# Patient Record
Sex: Female | Born: 1985 | Hispanic: Yes | Smoking: Never smoker
Health system: Southern US, Community
[De-identification: ages and names within clinical notes are randomized; demographics above are authoritative.]

## PROBLEM LIST (undated history)

## (undated) DIAGNOSIS — R609 Edema, unspecified: Secondary | ICD-10-CM

## (undated) DIAGNOSIS — G43909 Migraine, unspecified, not intractable, without status migrainosus: Secondary | ICD-10-CM

## (undated) DIAGNOSIS — D649 Anemia, unspecified: Secondary | ICD-10-CM

## (undated) HISTORY — PX: TUBAL LIGATION: SHX77

## (undated) HISTORY — DX: Edema, unspecified: R60.9

## (undated) HISTORY — DX: Anemia, unspecified: D64.9

---

## 2015-07-22 ENCOUNTER — Emergency Department (HOSPITAL_COMMUNITY)
Admission: EM | Admit: 2015-07-22 | Discharge: 2015-07-22 | Disposition: A | Discharge status: Home / Self Care | Payer: Self-pay | Attending: Emergency Medicine | Admitting: Emergency Medicine

## 2015-07-22 ENCOUNTER — Emergency Department (HOSPITAL_COMMUNITY): Payer: Self-pay

## 2015-07-22 ENCOUNTER — Encounter (HOSPITAL_COMMUNITY): Payer: Self-pay | Admitting: Nurse Practitioner

## 2015-07-22 DIAGNOSIS — Y998 Other external cause status: Secondary | ICD-10-CM | POA: Insufficient documentation

## 2015-07-22 DIAGNOSIS — Y9289 Other specified places as the place of occurrence of the external cause: Secondary | ICD-10-CM | POA: Insufficient documentation

## 2015-07-22 DIAGNOSIS — X58XXXA Exposure to other specified factors, initial encounter: Secondary | ICD-10-CM | POA: Insufficient documentation

## 2015-07-22 DIAGNOSIS — M545 Low back pain, unspecified: Secondary | ICD-10-CM

## 2015-07-22 DIAGNOSIS — Y9389 Activity, other specified: Secondary | ICD-10-CM | POA: Insufficient documentation

## 2015-07-22 DIAGNOSIS — S3992XA Unspecified injury of lower back, initial encounter: Secondary | ICD-10-CM | POA: Insufficient documentation

## 2015-07-22 DIAGNOSIS — Z87891 Personal history of nicotine dependence: Secondary | ICD-10-CM | POA: Insufficient documentation

## 2015-07-22 DIAGNOSIS — S8991XA Unspecified injury of right lower leg, initial encounter: Secondary | ICD-10-CM | POA: Insufficient documentation

## 2015-07-22 DIAGNOSIS — M25561 Pain in right knee: Secondary | ICD-10-CM

## 2015-07-22 MED ORDER — NAPROXEN 500 MG PO TABS: 500.0000 mg | ORAL_TABLET | Freq: Two times a day (BID) | ORAL | Status: DC

## 2015-07-22 MED ORDER — KETOROLAC TROMETHAMINE 60 MG/2ML IM SOLN
60.0000 mg | Freq: Once | INTRAMUSCULAR | Status: AC
Start: 2015-07-22 — End: 2015-07-22
  Administered 2015-07-22: 60 mg via INTRAMUSCULAR
  Filled 2015-07-22: qty 2

## 2015-07-22 MED ORDER — METHOCARBAMOL 500 MG PO TABS
500.0000 mg | ORAL_TABLET | Freq: Two times a day (BID) | ORAL | Status: DC
Start: 2015-07-22 — End: 2019-08-23

## 2015-07-22 NOTE — Discharge Instructions (Signed)
Back Pain, Adult °Back pain is very common in adults. The cause of back pain is rarely dangerous and the pain often gets better over time. The cause of your back pain may not be known. Some common causes of back pain include: °· Strain of the muscles or ligaments supporting the spine. °· Wear and tear (degeneration) of the spinal disks. °· Arthritis. °· Direct injury to the back. °For many people, back pain may return. Since back pain is rarely dangerous, most people can learn to manage this condition on their own. °HOME CARE INSTRUCTIONS °Watch your back pain for any changes. The following actions may help to lessen any discomfort you are feeling: °· Remain active. It is stressful on your back to sit or stand in one place for long periods of time. Do not sit, drive, or stand in one place for more than 30 minutes at a time. Take short walks on even surfaces as soon as you are able. Try to increase the length of time you walk each day. °· Exercise regularly as directed by your health care provider. Exercise helps your back heal faster. It also helps avoid future injury by keeping your muscles strong and flexible. °· Do not stay in bed. Resting more than 1-2 days can delay your recovery. °· Pay attention to your body when you bend and lift. The most comfortable positions are those that put less stress on your recovering back. Always use proper lifting techniques, including: °· Bending your knees. °· Keeping the load close to your body. °· Avoiding twisting. °· Find a comfortable position to sleep. Use a firm mattress and lie on your side with your knees slightly bent. If you lie on your back, put a pillow under your knees. °· Avoid feeling anxious or stressed. Stress increases muscle tension and can worsen back pain. It is important to recognize when you are anxious or stressed and learn ways to manage it, such as with exercise. °· Take medicines only as directed by your health care provider. Over-the-counter  medicines to reduce pain and inflammation are often the most helpful. Your health care provider may prescribe muscle relaxant drugs. These medicines help dull your pain so you can more quickly return to your normal activities and healthy exercise. °· Apply ice to the injured area: °· Put ice in a plastic bag. °· Place a towel between your skin and the bag. °· Leave the ice on for 20 minutes, 2-3 times a day for the first 2-3 days. After that, ice and heat may be alternated to reduce pain and spasms. °· Maintain a healthy weight. Excess weight puts extra stress on your back and makes it difficult to maintain good posture. °SEEK MEDICAL CARE IF: °· You have pain that is not relieved with rest or medicine. °· You have increasing pain going down into the legs or buttocks. °· You have pain that does not improve in one week. °· You have night pain. °· You lose weight. °· You have a fever or chills. °SEEK IMMEDIATE MEDICAL CARE IF:  °· You develop new bowel or bladder control problems. °· You have unusual weakness or numbness in your arms or legs. °· You develop nausea or vomiting. °· You develop abdominal pain. °· You feel faint. °  °This information is not intended to replace advice given to you by your health care provider. Make sure you discuss any questions you have with your health care provider. °  °Document Released: 04/30/2005 Document Revised: 05/21/2014 Document Reviewed: 09/01/2013 °Elsevier Interactive Patient Education ©2016 Else[employer]   Inc[employer]  Back Exercises The following exercises strengthen the muscles that help to support the back. They also help to keep the lower back flexible. Doing these exercises can help to prevent back pain or lessen existing pain. If you have back pain or discomfort, try doing these exercises 2-3 times each day or as told by your health care provider. When the pain goes away, do them once each day, but increase the number of times that you repeat the steps for each exercise (do more  repetitions). If you do not have back pain or discomfort, do these exercises once each day or as told by your health care provider. EXERCISES Single Knee to Chest Repeat these steps 3-5 times for each leg:  Lie on your back on a firm bed or the floor with your legs extended.  Bring one knee to your chest. Your other leg should stay extended and in contact with the floor.  Hold your knee in place by grabbing your knee or thigh.  Pull on your knee until you feel a gentle stretch in your lower back.  Hold the stretch for 10-30 seconds.  Slowly release and straighten your leg. Pelvic Tilt Repeat these steps 5-10 times:  Lie on your back on a firm bed or the floor with your legs extended.  Bend your knees so they are pointing toward the ceiling and your feet are flat on the floor.  Tighten your lower abdominal muscles to press your lower back against the floor. This motion will tilt your pelvis so your tailbone points up toward the ceiling instead of pointing to your feet or the floor.  With gentle tension and even breathing, hold this position for 5-10 seconds. Cat-Cow Repeat these steps until your lower back becomes more flexible:  Get into a hands-and-knees position on a firm surface. Keep your hands under your shoulders, and keep your knees under your hips. You may place padding under your knees for comfort.  Let your head hang down, and point your tailbone toward the floor so your lower back becomes rounded like the back of a cat.  Hold this position for 5 seconds.  Slowly lift your head and point your tailbone up toward the ceiling so your back forms a sagging arch like the back of a cow.  Hold this position for 5 seconds. Press-Ups Repeat these steps 5-10 times:  Lie on your abdomen (face-down) on the floor.  Place your palms near your head, about shoulder-width apart.  While you keep your back as relaxed as possible and keep your hips on the floor, slowly straighten  your arms to raise the top half of your body and lift your shoulders. Do not use your back muscles to raise your upper torso. You may adjust the placement of your hands to make yourself more comfortable.  Hold this position for 5 seconds while you keep your back relaxed.  Slowly return to lying flat on the floor. Bridges Repeat these steps 10 times:  Lie on your back on a firm surface.  Bend your knees so they are pointing toward the ceiling and your feet are flat on the floor.  Tighten your buttocks muscles and lift your buttocks off of the floor until your waist is at almost the same height as your knees. You should feel the muscles working in your buttocks and the back of your thighs. If you do not feel these muscles, slide your feet 1-2 inches farther away from your buttocks.  Hold this  position for 3-5 seconds.  Slowly lower your hips to the starting position, and allow your buttocks muscles to relax completely. If this exercise is too easy, try doing it with your arms crossed over your chest. Abdominal Crunches Repeat these steps 5-10 times: 1. Lie on your back on a firm bed or the floor with your legs extended. 2. Bend your knees so they are pointing toward the ceiling and your feet are flat on the floor. 3. Cross your arms over your chest. 4. Tip your chin slightly toward your chest without bending your neck. 5. Tighten your abdominal muscles and slowly raise your trunk (torso) high enough to lift your shoulder blades a tiny bit off of the floor. Avoid raising your torso higher than that, because it can put too much stress on your low back and it does not help to strengthen your abdominal muscles. 6. Slowly return to your starting position. Back Lifts Repeat these steps 5-10 times: 1. Lie on your abdomen (face-down) with your arms at your sides, and rest your forehead on the floor. 2. Tighten the muscles in your legs and your buttocks. 3. Slowly lift your chest off of the floor  while you keep your hips pressed to the floor. Keep the back of your head in line with the curve in your back. Your eyes should be looking at the floor. 4. Hold this position for 3-5 seconds. 5. Slowly return to your starting position. SEEK MEDICAL CARE IF:  Your back pain or discomfort gets much worse when you do an exercise.  Your back pain or discomfort does not lessen within 2 hours after you exercise. If you have any of these problems, stop doing these exercises right away. Do not do them again unless your health care provider says that you can. SEEK IMMEDIATE MEDICAL CARE IF:  You develop sudden, severe back pain. If this happens, stop doing the exercises right away. Do not do them again unless your health care provider says that you can.   This information is not intended to replace advice given to you by your health care provider. Make sure you discuss any questions you have with your health care provider.   Document Released: 06/07/2004 Document Revised: 01/19/2015 Document Reviewed: 06/24/2014 Elsevier Interactive Patient Education 2016 Else[employer] Inc.  Knee Pain Knee pain is a very common symptom and can have many causes. Knee pain often goes away when you follow your health care provider's instructions for relieving pain and discomfort at home. However, knee pain can develop into a condition that needs treatment. Some conditions may include:  Arthritis caused by wear and tear (osteoarthritis).  Arthritis caused by swelling and irritation (rheumatoid arthritis or gout).  A cyst or growth in your knee.  An infection in your knee joint.  An injury that will not heal.  Damage, swelling, or irritation of the tissues that support your knee (torn ligaments or tendinitis). If your knee pain continues, additional tests may be ordered to diagnose your condition. Tests may include X-rays or other imaging studies of your knee. You may also need to have fluid removed from your knee.  Treatment for ongoing knee pain depends on the cause, but treatment may include:  Medicines to relieve pain or swelling.  Steroid injections in your knee.  Physical therapy.  Surgery. HOME CARE INSTRUCTIONS  Take medicines only as directed by your health care provider.  Rest your knee and keep it raised (elevated) while you are resting.  Do not do things  that cause or worsen pain.  Avoid high-impact activities or exercises, such as running, jumping rope, or doing jumping jacks.  Apply ice to the knee area:  Put ice in a plastic bag.  Place a towel between your skin and the bag.  Leave the ice on for 20 minutes, 2-3 times a day.  Ask your health care provider if you should wear an elastic knee support.  Keep a pillow under your knee when you sleep.  Lose weight if you are overweight. Extra weight can put pressure on your knee.  Do not use any tobacco products, including cigarettes, chewing tobacco, or electronic cigarettes. If you need help quitting, ask your health care provider. Smoking may slow the healing of any bone and joint problems that you may have. SEEK MEDICAL CARE IF:  Your knee pain continues, changes, or gets worse.  You have a fever along with knee pain.  Your knee buckles or locks up.  Your knee becomes more swollen. SEEK IMMEDIATE MEDICAL CARE IF:   Your knee joint feels hot to the touch.  You have chest pain or trouble breathing.   This information is not intended to replace advice given to you by your health care provider. Make sure you discuss any questions you have with your health care provider.   Document Released: 02/25/2007 Document Revised: 05/21/2014 Document Reviewed: 12/14/2013 Elsevier Interactive Patient Education [employer] Else[employer] Inc.

## 2015-07-22 NOTE — ED Notes (Signed)
Pt endorses 2 days of right knee pain states happened all of a sudden while walking. Straightening knee causes increase pain. Patient also endorses lower back pain worse with movement and bending. Denies dysuria or abnormal bleeding. Pt takes ibuprofen for pain with mild relief.

## 2015-07-22 NOTE — ED Provider Notes (Signed)
CSN: [id number]     Arrival date & time 07/22/15  1513 History  By signing my name below, I, Lyndel Safe, attest that this documentation has been prepared under the direction and in the presence of Syna Gad, PA-C. Electronically Signed: Lyndel Safe, ED Scribe. 07/22/2015. 4:37 PM.   Chief Complaint  Patient presents with  . Knee Pain  . Back Pain   The history is provided by the patient. No language interpreter was used.   HPI Comments: Ellesse Antenucci is a 30 y.o. female who presents to the Emergency Department complaining of waxing and waning, anterior right knee pain present only with extension and pressure, onset suddenly while ambulating 2 days ago. Pt describes the feeling that her knee needed to pop when she was walking but that "nothing happened". There is no deformity noted to right knee, however she notes swelling to anterior right knee at onset of pain that has improved. She states the pain is only present with full extension of right leg and with putting pressure on right knee cap when she bends onto her knees. Additionally she reports the feeling of instability in her right leg with ambulation and states she has been walking with a limp secondary to knee pain. No h/o right knee injury. Pt has taken ibuprofen without significant relief but she has not applied ice or heat to the area. Denies numbness tingling or weakness of right foot. Denies pain, swelling or redness of the calf.   Pt also c/o constant, non-radiating, bilateral lower back pain onset 2-3 weeks ago, while bending over bathing her kids. Denies lift injury. This back pain is present with sitting in any position and is exacerbated with standing and bending over; but it is alleviated with arching of her back. She denies any urinary symptoms, radiation of pain down BLE, numbness, tingling or weakness, or any overlying skin changes. No loss of bladder or bowels.    History reviewed. No pertinent past medical  history. Past Surgical History  Procedure Laterality Date  . Tubal ligation     No family history on file. Social History  Substance Use Topics  . Smoking status: Former Games developer  . Smokeless tobacco: None  . Alcohol Use: No   OB History    No data available     Review of Systems  Genitourinary: Negative for dysuria, urgency, frequency and hematuria.  Musculoskeletal: Positive for back pain and arthralgias ( R knee). Negative for gait problem.  Skin: Negative for color change and wound.  Neurological: Negative for weakness and numbness.   Allergies  Iodine  Home Medications   Prior to Admission medications   Medication Sig Start Date End Date Taking? Authorizing Provider  methocarbamol (ROBAXIN) 500 MG tablet Take 1 tablet (500 mg total) by mouth 2 (two) times daily. 07/22/15   Luccas Towell, PA-C  naproxen (NAPROSYN) 500 MG tablet Take 1 tablet (500 mg total) by mouth 2 (two) times daily. 07/22/15   Nicolemarie Wooley, PA-C   BP 135/73 mmHg  Pulse 76  Temp(Src) 99 F (37.2 C) (Oral)  Resp 18  Ht  (1.575 m)  Wt 180 lb (81.647 kg)  BMI 32.91 kg/m2  SpO2 98%  LMP 07/21/2015 Physical Exam  Constitutional: She is oriented to person, place, and time. She appears well-developed and well-nourished. No distress.  HENT:  Head: Normocephalic and atraumatic.  Right Ear: External ear normal.  Left Ear: External ear normal.  Eyes: Conjunctivae are normal. Right eye exhibits no discharge. Left eye  exhibits no discharge. No scleral icterus.  Neck: Normal range of motion. Neck supple.  Cardiovascular: Normal rate and intact distal pulses.   Pedal pulse palpable.  Pulmonary/Chest: Effort normal. No respiratory distress.  Musculoskeletal: Normal range of motion.       Right knee: Normal. She exhibits normal range of motion, no swelling, no effusion, no ecchymosis, no deformity, normal alignment, no LCL laxity, normal patellar mobility and no MCL laxity. No tenderness found.        Lumbar back: Normal. She exhibits normal range of motion, no tenderness, no deformity and no spasm.       Legs: No obvious deformity or swelling of the right knee. No tenderness to palpation of the knee. No bony patella deformity. Full range of motion intact. No appreciable effusion. No ligamentous laxity. No erythema or warmth. No calf tenderness, swelling, palpable cords or erythema. Range of motion at the ankle and toes. Patient is able to walk though she favors the right leg. No tenderness to palpation over the lumbar region. No focal tenderness over the lumbar spine. Full range of motion of the lumbar and thoracic spine intact. No spasm felt. Full range of motion of the left lower extremity intact.  Neurological: She is alert and oriented to person, place, and time. Coordination normal.  5/5 strength of the bilateral lower extremities. Sensation to light touch intact throughout.  Skin: Skin is warm and dry. No erythema.  Erythema or warmth of the right knee. No skin changes noted over the lumbar region.  Psychiatric: She has a normal mood and affect. Her behavior is normal.  Nursing note and vitals reviewed.   ED Course  Procedures  DIAGNOSTIC STUDIES: Oxygen Saturation is 98% on RA, normal by my interpretation.    COORDINATION OF CARE: 4:31 PM Discussed treatment plan with pt at bedside and pt agreed to plan.   Imaging Review Dg Knee Complete 4 Views Right  07/22/2015  CLINICAL DATA:  30 year old female with acute right knee pain and swelling for 2 days. No known injury. Initial encounter. EXAM: RIGHT KNEE - COMPLETE 4+ VIEW COMPARISON:  None. FINDINGS: There is no evidence of fracture, dislocation, or joint effusion. There is no evidence of arthropathy or other focal bone abnormality. Soft tissues are unremarkable. IMPRESSION: Negative. Electronically Signed   By: Harmon Pier M.D.   On: 07/22/2015 16:23   I have personally reviewed and evaluated these images results as part of my  medical decision-making.   MDM   Final diagnoses:  Bilateral low back pain without sciatica  Right knee pain   30 year old female presenting with back pain 2 weeks and right knee pain 2 days. Vital signs stable. Patient is nontoxic-appearing. Right leg is neurovascularly intact with FROM. No tenderness over right knee. No tenderness over lumbar region. No bony deformity. Non-focal neuro exam. Patient knee X-Ray negative for obvious fracture or dislocation. Pain managed in ED with toradol. Pt is able to ambulate though with some discomfort. No loss of bowel or bladder control. No numbness or weakness in the lower extremities. No concern for cauda equina. Crutches given for comfort and conservative therapy recommended. Discussed RICE therapy and use of OTC pain relievers. Will also give short course of muscle relaxer for back pain. Discussed side effects of muscle relaxer and avoiding driving. Pt advised to follow up with orthopedics if symptoms persist. Return precautions discussed at bedside and given in discharge paperwork. Pt is stable for discharge.  I personally performed the services described in  this documentation, which was scribed in my presence. The recorded information has been reviewed and is accurate.    Rolm GalaStevi Zayne Draheim, PA-C 07/22/15 1719  Gwyneth SproutWhitney Plunkett, MD 07/23/15 [id number]

## 2015-10-22 ENCOUNTER — Emergency Department (HOSPITAL_COMMUNITY)
Admission: EM | Admit: 2015-10-22 | Discharge: 2015-10-22 | Disposition: A | Discharge status: Home / Self Care | Payer: Self-pay | Attending: Emergency Medicine | Admitting: Emergency Medicine

## 2015-10-22 ENCOUNTER — Encounter (HOSPITAL_COMMUNITY): Payer: Self-pay

## 2015-10-22 DIAGNOSIS — Z79899 Other long term (current) drug therapy: Secondary | ICD-10-CM | POA: Insufficient documentation

## 2015-10-22 DIAGNOSIS — G5603 Carpal tunnel syndrome, bilateral upper limbs: Secondary | ICD-10-CM | POA: Insufficient documentation

## 2015-10-22 DIAGNOSIS — Z87891 Personal history of nicotine dependence: Secondary | ICD-10-CM | POA: Insufficient documentation

## 2015-10-22 DIAGNOSIS — Z791 Long term (current) use of non-steroidal anti-inflammatories (NSAID): Secondary | ICD-10-CM | POA: Insufficient documentation

## 2015-10-22 MED ORDER — NAPROXEN 500 MG PO TABS
500.0000 mg | ORAL_TABLET | Freq: Two times a day (BID) | ORAL | Status: DC | PRN
Start: 2015-10-22 — End: 2019-08-23

## 2015-10-22 NOTE — ED Notes (Addendum)
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

## 2015-10-22 NOTE — ED Notes (Signed)
Pt states unable to sleep due to fingers in left hand painful and numbness x 2 days. Denies any neck or back pain. Denies any trauma to arms/fingers. Pt states decreased grip strength.

## 2015-10-22 NOTE — ED Provider Notes (Signed)
CSN: [id number]     Arrival date & time 10/22/15  0017 History   First MD Initiated Contact with Patient 10/22/15 [phone]     Chief Complaint  Patient presents with  . Numbness     (Consider location/radiation/quality/duration/timing/severity/associated sxs/prior Treatment) The history is provided by the patient and medical records. No language interpreter was used.   Exa Julie Briggs is a 30 y.o. female  with no pertinent PMH who presents to the Emergency Department complaining of worsening 5/10 bilateral wrist pain 2 days. Associated symptoms include numbness of the hands and fingers sparing pinky. Yesterday she noticed decreased grip strength. Symptoms worst at night or first thing in the morning. 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 No hx of similar symptoms. No medication or treatments PTA for symptoms.    History reviewed. No pertinent past medical history. Past Surgical History  Procedure Laterality Date  . Tubal ligation     History reviewed. No pertinent family history. Social History  Substance Use Topics  . Smoking status: Former Games developer  . Smokeless tobacco: None  . Alcohol Use: No   OB History    No data available     Review of Systems  Musculoskeletal: Positive for arthralgias.  Neurological: Positive for numbness.   10 Systems reviewed and are negative for acute change except as noted in the HPI.    Allergies  Iodine  Home Medications   Prior to Admission medications   Medication Sig Start Date End Date Taking? Authorizing Provider  methocarbamol (ROBAXIN) 500 MG tablet Take 1 tablet (500 mg total) by mouth 2 (two) times daily. 07/22/15   Stevi Barrett, PA-C  naproxen (NAPROSYN) 500 MG tablet Take 1 tablet (500 mg total) by mouth 2 (two) times daily as needed. 10/22/15   Shakiyla Kook Pilcher Saraia Platner, PA-C   BP 121/74 mmHg  Pulse 66  Temp(Src) 98.6 F (37 C) (Oral)  Resp 16   SpO2 100%  LMP 10/01/2015 Physical Exam  Constitutional: She is oriented to person, place, and time. She appears well-developed and well-nourished.  Alert and in no acute distress  HENT:  Head: Normocephalic and atraumatic.  Neck: Normal range of motion. Neck supple.  Cardiovascular: Normal rate, regular rhythm and normal heart sounds.   Pulmonary/Chest: Effort normal and breath sounds normal. No respiratory distress.  Musculoskeletal:  Bilateral wrists: Full ROM. No joint effusion noted. No erythema or warmth overlaying the joint. No TTP. Decreased sensation of radial aspect of hand compared to ulnar. 2+ radial pulse. + Tinel's sign. 5/5 muscle strength in bilateral UE's including grip strength.   Neurological: She is alert and oriented to person, place, and time.  Skin: Skin is warm and dry.  Nursing note and vitals reviewed.   ED Course  Procedures (including critical care time) Labs Review Labs Reviewed - No data to display  Imaging Review No results found. I have personally reviewed and evaluated these images and lab results as part of my medical decision-making.   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 On exam, + tinel's and decreased sensation in median nerve distribution. Grip strength strong. No erythema or swelling. Findings c/w carpal tunnel. Wrist braces given  in ED. Rx for naproxen given. Symptomatic home care instructions discussed. PCP follow up encouraged. All questions answered.   [facility] BaltimoreJaime Pilcher Kemiyah Tarazon, PA-C 10/22/15 0448  Gwyneth SproutWhitney Plunkett, MD 10/22/15 [phone]

## 2015-10-22 NOTE — Progress Notes (Signed)
Orthopedic Tech Progress Note Patient Details:  Marcheta GrammesMayerlin Aguilera 06/23/1985 [id number]  Ortho Devices Type of Ortho Device: Velcro wrist splint Ortho Device/Splint Location: bi velcro wrist splint Ortho Device/Splint Interventions: Ordered, Application   Trinna PostMartinez, Arriyana Rodell J 10/22/2015, 4:14 AM

## 2015-10-22 NOTE — Discharge Instructions (Signed)
1. Medications: Take naproxen (your anti-inflammatory) as needed, continue usual home medications 2. Treatment: rest, ice affected area twice daily (instructions below), wear wrist splints **especially at night!!  3. Follow Up: Please follow up with your primary doctor for discussion of your diagnoses and further evaluation after today's visit; Please return to the ER for new or worsening symptoms, any additional concerns.   COLD THERAPY DIRECTIONS:  Ice or gel packs can be used to reduce both pain and swelling. Ice is the most helpful within the first 24 to 48 hours after an injury or flareup from overusing a muscle or joint.  Ice is effective, has very few side effects, and is safe for most people to use.   If you expose your skin to cold temperatures for too long or without the proper protection, you can damage your skin or nerves. Watch for signs of skin damage due to cold.   HOME CARE INSTRUCTIONS  Follow these tips to use ice and cold packs safely.  Place a dry or damp towel between the ice and skin. A damp towel will cool the skin more quickly, so you may need to shorten the time that the ice is used.  For a more rapid response, add gentle compression to the ice.  Ice for no more than 10 to 20 minutes at a time. The bonier the area you are icing, the less time it will take to get the benefits of ice.  Check your skin after 5 minutes to make sure there are no signs of a poor response to cold or skin damage.  Rest 20 minutes or more in between uses.  Once your skin is numb, you can end your treatment. You can test numbness by very lightly touching your skin. The touch should be so light that you do not see the skin dimple from the pressure of your fingertip. When using ice, most people will feel these normal sensations in this order: cold, burning, aching, and numbness.

## 2016-04-26 ENCOUNTER — Emergency Department (HOSPITAL_COMMUNITY)
Admission: EM | Admit: 2016-04-26 | Discharge: 2016-04-26 | Disposition: A | Discharge status: Home / Self Care | Payer: Self-pay | Attending: Emergency Medicine | Admitting: Emergency Medicine

## 2016-04-26 ENCOUNTER — Encounter (HOSPITAL_COMMUNITY): Payer: Self-pay | Admitting: Emergency Medicine

## 2016-04-26 DIAGNOSIS — Y929 Unspecified place or not applicable: Secondary | ICD-10-CM | POA: Insufficient documentation

## 2016-04-26 DIAGNOSIS — L089 Local infection of the skin and subcutaneous tissue, unspecified: Secondary | ICD-10-CM

## 2016-04-26 DIAGNOSIS — Z87891 Personal history of nicotine dependence: Secondary | ICD-10-CM | POA: Insufficient documentation

## 2016-04-26 DIAGNOSIS — Y939 Activity, unspecified: Secondary | ICD-10-CM | POA: Insufficient documentation

## 2016-04-26 DIAGNOSIS — K047 Periapical abscess without sinus: Secondary | ICD-10-CM | POA: Insufficient documentation

## 2016-04-26 DIAGNOSIS — S80861A Insect bite (nonvenomous), right lower leg, initial encounter: Secondary | ICD-10-CM | POA: Insufficient documentation

## 2016-04-26 DIAGNOSIS — W57XXXA Bitten or stung by nonvenomous insect and other nonvenomous arthropods, initial encounter: Secondary | ICD-10-CM | POA: Insufficient documentation

## 2016-04-26 DIAGNOSIS — Y999 Unspecified external cause status: Secondary | ICD-10-CM | POA: Insufficient documentation

## 2016-04-26 MED ORDER — TRAMADOL HCL 50 MG PO TABS: 50.0000 mg | ORAL_TABLET | Freq: Four times a day (QID) | ORAL | 0 refills | Status: DC | PRN

## 2016-04-26 MED ORDER — CLINDAMYCIN HCL 150 MG PO CAPS: 300.0000 mg | ORAL_CAPSULE | Freq: Three times a day (TID) | ORAL | 0 refills | Status: DC

## 2016-04-26 MED ORDER — CLINDAMYCIN HCL 150 MG PO CAPS
300.0000 mg | ORAL_CAPSULE | Freq: Once | ORAL | Status: AC
  Administered 2016-04-26: 300 mg via ORAL
  Filled 2016-04-26: qty 2

## 2016-04-26 NOTE — ED Triage Notes (Signed)
Patient reports worsening right lower molar pain onset 2 days ago unrelieved by OTC Tylenol , pt. added skin rash at right lateral shin .

## 2016-04-26 NOTE — ED Notes (Signed)
Pt departed in NAD, refused use of wheelchair.  

## 2016-04-26 NOTE — ED Provider Notes (Signed)
[facility]-EMERGENCY DEPT Provider Note   By signing my name below, I, Earmon PhoenixJennifer Waddell, attest that this documentation has been prepared under the direction and in the presence of [facility] Pax Reasoner, [place]. Electronically Signed: Earmon PhoenixJennifer Waddell, ED Scribe. 04/26/16. 7:30 PM.    History   Chief Complaint Chief Complaint  Patient presents with  . Dental Pain   The history is provided by the patient and medical records. No language interpreter was used.  Dental Pain   This is a new problem. The current episode started 2 days ago. The problem occurs constantly. The problem has not changed since onset.The pain is moderate. She has tried acetaminophen for the symptoms. The treatment provided no relief.    HPI Comments:  Julie Briggs is a 30 y.o. female who presents to the Emergency Department complaining of right lower dental pain that began two days ago. She reports associated swelling of the right side of her face. She has been taking Tylenol with no significant relief of the pain. There are no modifying factors noted. She denies fever, chills, nausea, vomiting, difficulty breathing or swallowing, drooling or trismus.  She also reports a small area of redness to the right lower leg that appeared yesterday. She is unsure if something bit her. She has not done anything to treat the area. She denies modifying factors. She denies drainage from the area.   History reviewed. No pertinent past medical history.  There are no active problems to display for this patient.   Past Surgical History:  Procedure Laterality Date  . TUBAL LIGATION      OB History    No data available       Home Medications    Prior to Admission medications   Medication Sig Start Date End Date Taking? Authorizing Provider  clindamycin (CLEOCIN) 150 MG capsule Take 2 capsules (300 mg total) by mouth 3 (three) times daily. 04/26/16   Shantele Reller Orlene OchM Elgene Coral, NP  methocarbamol (ROBAXIN) 500 MG tablet Take 1 tablet (500 mg total) by  mouth 2 (two) times daily. 07/22/15   Stevi Barrett, PA-C  naproxen (NAPROSYN) 500 MG tablet Take 1 tablet (500 mg total) by mouth 2 (two) times daily as needed. 10/22/15   Chase PicketJaime Pilcher Ward, PA-C  traMADol (ULTRAM) 50 MG tablet Take 1 tablet (50 mg total) by mouth every 6 (six) hours as needed. 04/26/16   Kylar Speelman Orlene OchM Bailyn Spackman, NP    Family History No family history on file.  Social History Social History  Substance Use Topics  . Smoking status: Former Games developermoker  . Smokeless tobacco: Never Used  . Alcohol use No     Allergies   Iodine   Review of Systems Review of Systems  Constitutional: Negative for chills and fever.  HENT: Positive for dental problem and facial swelling. Negative for drooling and trouble swallowing.   Respiratory: Negative for shortness of breath.   Gastrointestinal: Negative for abdominal pain, nausea and vomiting.  Skin: Positive for color change.  all other systems negative   Physical Exam Updated Vital Signs BP 136/75 (BP Location: Right Arm)   Pulse 95   Temp 98.6 F (37 C) (Oral)   Resp 19   LMP 04/12/2016 (Approximate)   SpO2 96%   Physical Exam  Constitutional: She is oriented to person, place, and time. She appears well-developed and well-nourished. No distress.  HENT:  Right Ear: Tympanic membrane and ear canal normal.  Left Ear: Tympanic membrane and ear canal normal.  Mouth/Throat: Uvula is midline, oropharynx is clear  and moist and mucous membranes are normal. No trismus in the jaw. Abnormal dentition. Dental abscesses and dental caries present.    First molar on lower right is decayed with swelling of gingiva surrounding the tooth consistent with abscess. Mild facial swelling on the right.  Eyes: EOM are normal. Pupils are equal, round, and reactive to light.  Sclera clear bilaterally.  Neck: Neck supple.  Cardiovascular: Normal rate and regular rhythm.   Pulmonary/Chest: Effort normal and breath sounds normal.  Abdominal: Soft. There is no  tenderness.  Musculoskeletal: Normal range of motion.  Lymphadenopathy:    She has cervical adenopathy.  Neurological: She is alert and oriented to person, place, and time. No cranial nerve deficit.  Skin: Skin is warm and dry. There is erythema.  1 cm area of redness with pustular center and tenderness on right lower leg.  Psychiatric: She has a normal mood and affect. Her behavior is normal.  Nursing note and vitals reviewed.    ED Treatments / Results   DIAGNOSTIC STUDIES: Oxygen Saturation is 96% on RA, adequate by my interpretation.   COORDINATION OF CARE: 7:28 PM- Will prescribe Clindamycin and dental referral. Pt verbalizes understanding and agrees to plan.  Medications  clindamycin (CLEOCIN) capsule 300 mg (300 mg Oral Given 04/26/16 1940)   Labs (all labs ordered are listed, but only abnormal results are displayed) Labs Reviewed - No data to display   Radiology No results found.  Procedures Procedures (including critical care time)  Medications Ordered in ED Medications  clindamycin (CLEOCIN) capsule 300 mg (300 mg Oral Given 04/26/16 1940)     Initial Impression / Assessment and Plan / ED Course  I have reviewed the triage vital signs and the nursing notes.  Clinical Course     Patient with dentalgia.  No abscess requiring immediate incision and drainage.  Exam not concerning for Ludwig's angina or pharyngeal abscess.  Will treat with Clindamycin. Pt instructed to follow-up with dentist.  Discussed return precautions. Pt safe for discharge. Also area to the right lower leg that appears as an infected insect bite.  I personally performed the services described in this documentation, which was scribed in my presence. The recorded information has been reviewed and is accurate.   Final Clinical Impressions(s) / ED Diagnoses   Final diagnoses:  Dental abscess  Infected insect bite of lower leg, right, initial encounter    New Prescriptions Discharge  Medication List as of 04/26/2016  7:30 PM    START taking these medications   Details  clindamycin (CLEOCIN) 150 MG capsule Take 2 capsules (300 mg total) by mouth 3 (three) times daily., Starting Thu 04/26/2016, Print    traMADol (ULTRAM) 50 MG tablet Take 1 tablet (50 mg total) by mouth every 6 (six) hours as needed., Starting Thu 04/26/2016, Print         [place]oylestownHope M Yehuda Printup, NP 04/26/16 2325    Nira ConnPedro Eduardo Cardama, MD 05/01/16 [phone]

## 2016-06-21 ENCOUNTER — Emergency Department (HOSPITAL_COMMUNITY): Payer: Self-pay

## 2016-06-21 ENCOUNTER — Emergency Department (HOSPITAL_COMMUNITY)
Admission: EM | Admit: 2016-06-21 | Discharge: 2016-06-21 | Disposition: A | Discharge status: Home / Self Care | Payer: Self-pay | Attending: Emergency Medicine | Admitting: Emergency Medicine

## 2016-06-21 ENCOUNTER — Encounter (HOSPITAL_COMMUNITY): Payer: Self-pay | Admitting: Emergency Medicine

## 2016-06-21 DIAGNOSIS — Z87891 Personal history of nicotine dependence: Secondary | ICD-10-CM | POA: Insufficient documentation

## 2016-06-21 DIAGNOSIS — J189 Pneumonia, unspecified organism: Secondary | ICD-10-CM | POA: Insufficient documentation

## 2016-06-21 MED ORDER — AMOXICILLIN-POT CLAVULANATE 875-125 MG PO TABS: 1.0000 | ORAL_TABLET | Freq: Two times a day (BID) | ORAL | 0 refills | Status: DC

## 2016-06-21 MED ORDER — AMOXICILLIN-POT CLAVULANATE 875-125 MG PO TABS
1.0000 | ORAL_TABLET | Freq: Once | ORAL | Status: AC
  Administered 2016-06-21: 1 via ORAL
  Filled 2016-06-21: qty 1

## 2016-06-21 MED ORDER — SODIUM CHLORIDE 0.9 % IV BOLUS (SEPSIS)
1000.0000 mL | Freq: Once | INTRAVENOUS | Status: AC
  Administered 2016-06-21: 1000 mL via INTRAVENOUS

## 2016-06-21 MED ORDER — ACETAMINOPHEN 325 MG PO TABS
650.0000 mg | ORAL_TABLET | Freq: Once | ORAL | Status: AC | PRN
  Administered 2016-06-21: 650 mg via ORAL

## 2016-06-21 MED ORDER — DEXTROMETHORPHAN HBR 15 MG/5ML PO SYRP: 10.0000 mL | ORAL_SOLUTION | Freq: Four times a day (QID) | ORAL | 0 refills | Status: DC | PRN

## 2016-06-21 MED ORDER — ACETAMINOPHEN 325 MG PO TABS
ORAL_TABLET | ORAL | Status: AC
  Filled 2016-06-21: qty 2

## 2016-06-21 MED ORDER — KETOROLAC TROMETHAMINE 30 MG/ML IJ SOLN
15.0000 mg | Freq: Once | INTRAMUSCULAR | Status: AC
  Administered 2016-06-21: 15 mg via INTRAVENOUS
  Filled 2016-06-21: qty 1

## 2016-06-21 NOTE — ED Notes (Signed)
ED Provider at bedside. 

## 2016-06-21 NOTE — ED Triage Notes (Signed)
Pt sts fever and cough x 3 days

## 2016-06-21 NOTE — ED Provider Notes (Signed)
[facility]-EMER[facility]Y DEPT Provider Note   CSN: [id number] Arrival date & time: 06/21/16  1427   By signing my name below, I, Soijett Blue, attest that this documentation has been prepared under the direction and in the presence of Gerhard Munch, MD. Electronically Signed: Soijett Blue, ED Scribe. 06/21/16. 4:37 PM.  History   Chief Complaint Chief Complaint  Patient presents with  . Fever  . Cough    HPI Julie Briggs is a 31 y.o. female who presents to the Emergency Department complaining of max fever of 103.8 onset 3 days ago. Pt reports associated productive cough x green sputum with streaks of blood, resolved vomiting, decreased PO intake, fatigue, and SOB. Pt has tried tylenol and ibuprofen with no relief of her symptoms. Pt notes that she has sick contacts of her children who have similar symptoms. She denies leg swelling, recent travel, and any other symptoms. Pt denies drinking or smoking cigarettes at this time. Pt notes that she is otherwise healthy. 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    The history is provided by the patient. No language interpreter was used.    History reviewed. No pertinent past medical history.  There are no active problems to display for this patient.   Past Surgical History:  Procedure Laterality Date  . TUBAL LIGATION      OB History    No data available       Home Medications    Prior to Admission medications   Medication Sig Start Date End Date Taking? Authorizing Provider  clindamycin (CLEOCIN) 150 MG capsule Take 2 capsules (300 mg total) by mouth 3 (three) times daily. 04/26/16   Hope Orlene Och, NP  methocarbamol (ROBAXIN) 500 MG tablet Take 1 tablet (500 mg total) by mouth 2 (two) times daily. 07/22/15   Stevi Barrett, PA-C  naproxen (NAPROSYN) 500 MG tablet Take 1 tablet (500 mg total) by mouth 2 (two) times daily as needed. 10/22/15   Chase Picket Ward, PA-C  traMADol (ULTRAM) 50 MG tablet Take  1 tablet (50 mg total) by mouth every 6 (six) hours as needed. 04/26/16   Hope Orlene Och, NP    Family History History reviewed. No pertinent family history.  Social History Social History  Substance Use Topics  . Smoking status: Former Games developer  . Smokeless tobacco: Never Used  . Alcohol use No     Allergies   Iodine   Review of Systems Review of Systems  Constitutional:       Per HPI, otherwise negative  HENT:       Per HPI, otherwise negative  Respiratory:       Per HPI, otherwise negative  Cardiovascular:       Per HPI, otherwise negative  Gastrointestinal: Negative for vomiting.  Endocrine:       Negative aside from HPI  Genitourinary:       Neg aside from HPI   Musculoskeletal:       Per HPI, otherwise negative  Skin: Negative.   Neurological: Negative for syncope.     Physical Exam Updated Vital Signs BP 123/77 (BP Location: Right Arm)   Pulse 85   Temp 99.9 F (37.7 C) (Oral)   Resp 18   SpO2 97%   Physical Exam  Constitutional: She is oriented to person, place, and time. She appears well-developed and well-nourished. No distress.  HENT:  Head: Normocephalic and atraumatic.  Eyes: Conjunctivae and EOM are normal.  Cardiovascular: Normal rate, regular  rhythm and normal heart sounds.   Pulmonary/Chest: Effort normal and breath sounds normal. No stridor. No respiratory distress.  Diminished breath sounds  Abdominal: She exhibits no distension.  Musculoskeletal: She exhibits no edema.  Neurological: She is alert and oriented to person, place, and time. No cranial nerve deficit.  Skin: Skin is warm and dry.  Psychiatric: She has a normal mood and affect.  Nursing note and vitals reviewed.    ED Treatments / Results  DIAGNOSTIC STUDIES: Oxygen Saturation is 97% on RA, nl by my interpretation.    COORDINATION OF CARE: 4:34 PM Discussed treatment plan with pt at bedside which includes tylenol and CXR, IV fluids, toradol injection, and pt agreed to  plan.   Radiology Dg Chest 2 View  Result Date: 06/21/2016 CLINICAL DATA:  Flu-like symptoms for the past 3 days with fever to 1003.8, blood tinged productive cough, sore throat, lethargy. Former smoker. EXAM: CHEST  2 VIEW COMPARISON:  None in PACs FINDINGS: The lungs are well-expanded. The interstitial markings are coarse bilaterally. There is no alveolar infiltrate. There is no pleural effusion. The heart and pulmonary vascularity are normal. The mediastinum is normal in width. The trachea is midline. The bony thorax exhibits no acute abnormality. IMPRESSION: Moderately increased interstitial markings may reflect interstitial pneumonia or less likely the patient's smoking history. There is no alveolar pneumonia. Follow-up radiographs following anticipated antibiotic therapy are recommended unless the patient's symptoms completely resolve. Electronically Signed   By: David  Swazil[place] M.D.   On: 06/21/2016 16:20    Procedures Procedures (including critical care time)  Medications Ordered in ED Medications  acetaminophen (TYLENOL) 325 MG tablet (not administered)  acetaminophen (TYLENOL) tablet 650 mg (650 mg Oral Given 06/21/16 1517)     Initial Impression / Assessment and Plan / ED Course  I have reviewed the triage vital signs and the nursing notes.  Pertinent imaging results that were available during my care of the patient were reviewed by me and considered in my medical decision making (see chart for details).  6:04 PM Patient has received initial antibiotics, IV fluids, Toradol, with some improvement in her condition. Discussed all findings again at length, return precautions, follow-up instructions.   Final Clinical Impressions(s) / ED Diagnoses   Final diagnoses:  Community acquired pneumonia of right lung, unspecified part of lung    New Prescriptions New Prescriptions   AMOXICILLIN-CLAVULANATE (AUGMENTIN) 875-125 MG TABLET    Take 1 tablet by mouth every 12 (twelve) hours.     DEXTROMETHORPHAN 15 MG/5ML SYRUP    Take 10 mLs (30 mg total) by mouth 4 (four) times daily as needed for cough.    I personally performed the services described in this documentation, which was scribed in my presence. The recorded information has been reviewed and is accurate.    Previously well young female presents several days of cough, so no pneumonia. Evidence for bacteremia, sepsis. Patient is awake and alert, tolerates initial antibiotics well, is hemodynamically stable. She was discharged in stable condition with outpatient follow-up.    Gerhard Munchobert Anhelica Fowers, MD 06/21/16 [phone]

## 2016-06-21 NOTE — ED Notes (Signed)
Patient currently in xray ?

## 2017-03-29 IMAGING — DX DG KNEE COMPLETE 4+V*R*
4 series · 4 of 4 positions shown · non-contrast
Comparison: None.

CLINICAL DATA: 29-year-old female with acute right knee pain and
swelling for 2 days. No known injury. Initial encounter.

EXAM:
RIGHT KNEE - COMPLETE 4+ VIEW

[knee ap]
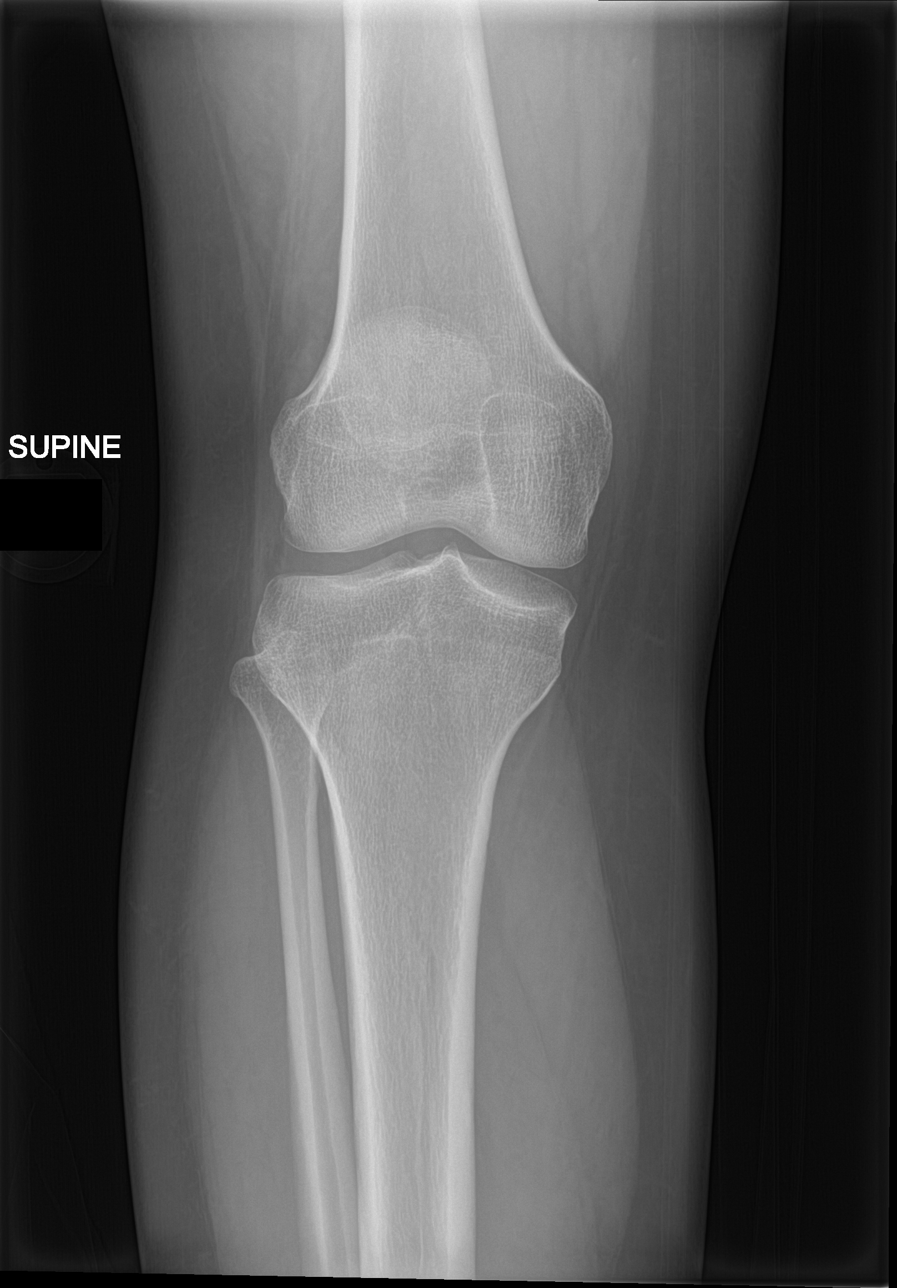

[knee lat]
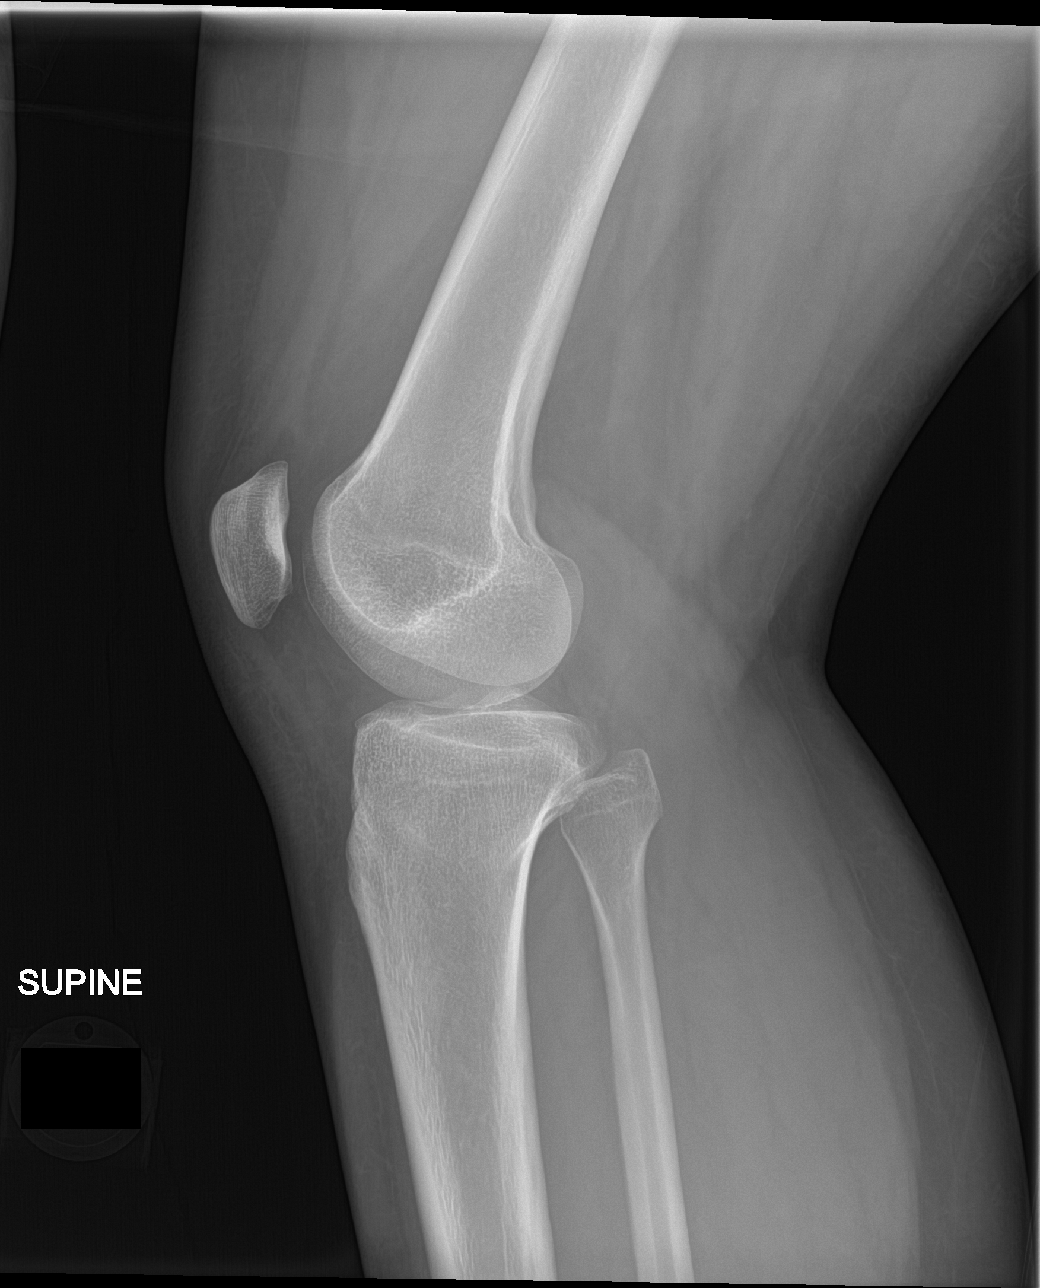

[knee obl (1 of 2)]
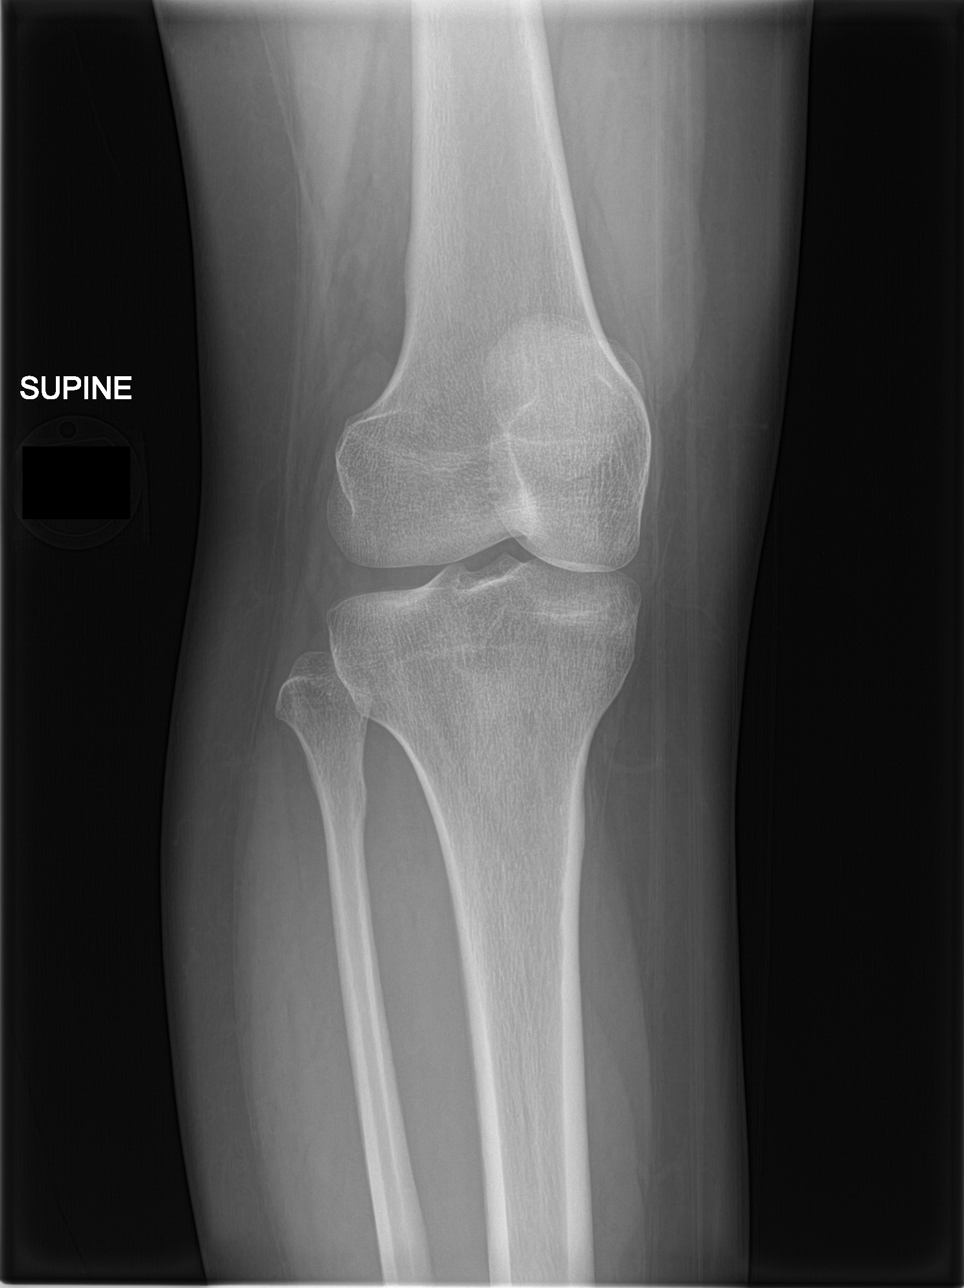

[knee obl (2 of 2)]
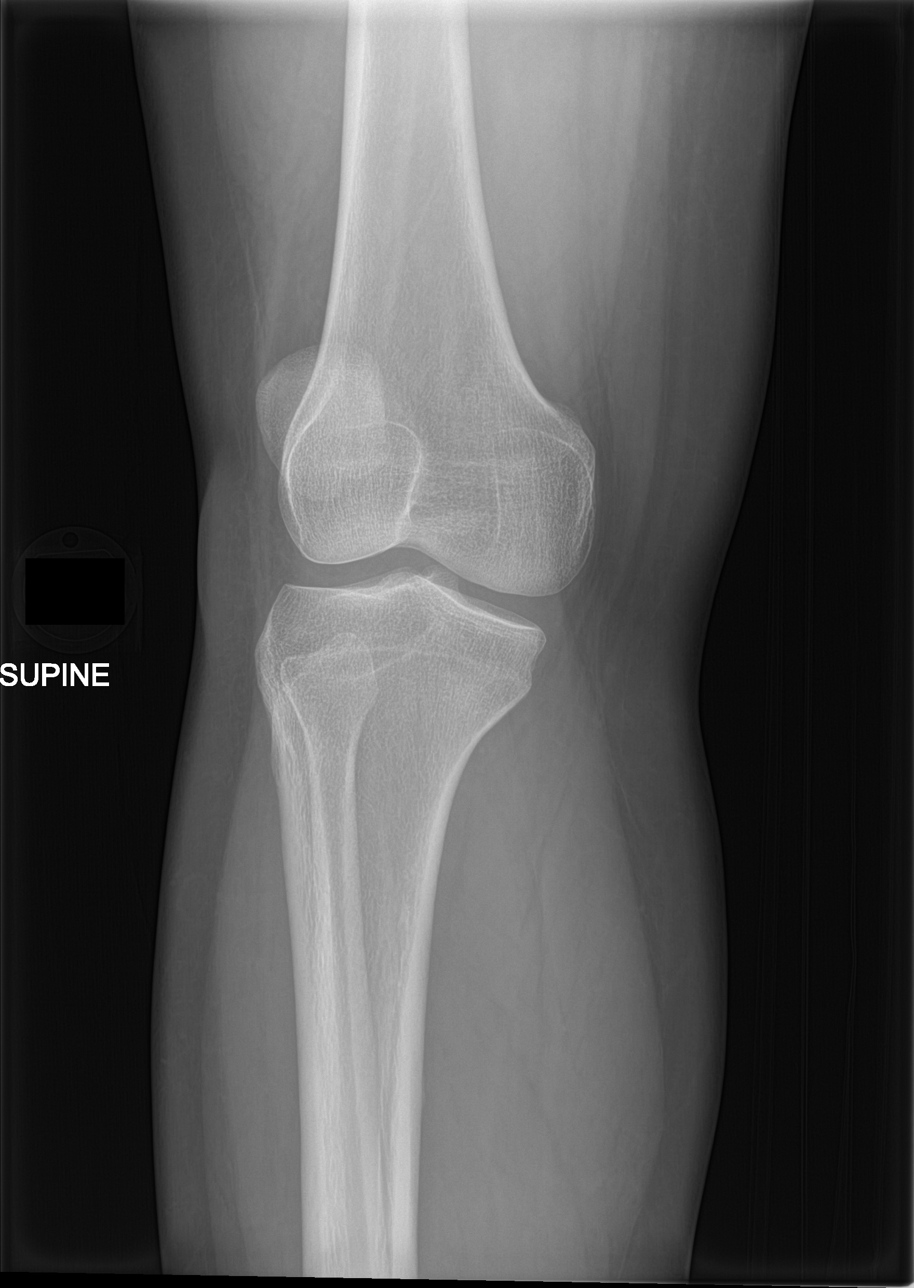

[4 of 4 positions shown; findings below may reference images not displayed]

FINDINGS: There is no evidence of fracture, dislocation, or joint effusion.
There is no evidence of arthropathy or other focal bone abnormality.
Soft tissues are unremarkable.
IMPRESSION: Negative.

## 2017-04-25 ENCOUNTER — Ambulatory Visit: Payer: Self-pay | Admitting: Adult Health

## 2017-12-24 ENCOUNTER — Other Ambulatory Visit: Payer: Self-pay

## 2017-12-24 ENCOUNTER — Encounter (HOSPITAL_COMMUNITY): Payer: Self-pay | Admitting: Emergency Medicine

## 2017-12-24 ENCOUNTER — Encounter (HOSPITAL_COMMUNITY): Payer: Self-pay | Admitting: *Deleted

## 2017-12-24 ENCOUNTER — Emergency Department (HOSPITAL_COMMUNITY)
Admission: EM | Admit: 2017-12-24 | Discharge: 2017-12-24 | Disposition: A | Discharge status: Home / Self Care | Payer: Medicaid Other | Attending: Emergency Medicine | Admitting: Emergency Medicine

## 2017-12-24 DIAGNOSIS — G43011 Migraine without aura, intractable, with status migrainosus: Secondary | ICD-10-CM | POA: Diagnosis not present

## 2017-12-24 DIAGNOSIS — F17200 Nicotine dependence, unspecified, uncomplicated: Secondary | ICD-10-CM | POA: Insufficient documentation

## 2017-12-24 DIAGNOSIS — Z79899 Other long term (current) drug therapy: Secondary | ICD-10-CM | POA: Diagnosis not present

## 2017-12-24 DIAGNOSIS — R51 Headache: Secondary | ICD-10-CM | POA: Diagnosis present

## 2017-12-24 HISTORY — DX: Migraine, unspecified, not intractable, without status migrainosus: G43.909

## 2017-12-24 MED ORDER — DIPHENHYDRAMINE HCL 25 MG PO CAPS
25.0000 mg | ORAL_CAPSULE | Freq: Once | ORAL | Status: AC
  Administered 2017-12-24: 25 mg via ORAL
  Filled 2017-12-24: qty 1

## 2017-12-24 MED ORDER — DEXAMETHASONE 4 MG PO TABS
10.0000 mg | ORAL_TABLET | Freq: Once | ORAL | Status: AC
  Administered 2017-12-24: 10 mg via ORAL
  Filled 2017-12-24: qty 2

## 2017-12-24 MED ORDER — PROCHLORPERAZINE MALEATE 10 MG PO TABS
10.0000 mg | ORAL_TABLET | Freq: Once | ORAL | Status: AC
  Administered 2017-12-24: 10 mg via ORAL
  Filled 2017-12-24: qty 1

## 2017-12-24 NOTE — ED Triage Notes (Signed)
Pt reports she has had a constant headache for about 2 days, associated with nausea, dizziness, and sensitivity to light. Last took advil migraine around 3am for the headache.

## 2017-12-24 NOTE — ED Provider Notes (Signed)
[facility]-EMERGENCY DEPT Provider Note  CSN: [id number] Arrival date & time: 12/24/17 0456  Chief Complaint(s) Headache  HPI Julie Briggs is a 32 y.o. female with a history of migraines who presents to the emergency department with 2 weeks of migrainous headaches.  She reports that they are right-sided, aching and throbbing in nature.  Exacerbated with light and loud sounds.  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  She is endorsing intermittent dizziness and nausea is brief in nature and resolve spontaneously.  She denies any recent fevers or infections.  No focal deficits, no visual disturbance.  Patient has tried taking Advil migraine which typically helps her pain however she has not found relief with it.  HPI  Past Medical History Past Medical History:  Diagnosis Date  . Migraine    There are no active problems to display for this patient.  Home Medication(s) Prior to Admission medications   Medication Sig Start Date End Date Taking? Authorizing Provider  ibuprofen (ADVIL,MOTRIN) 200 MG tablet Take 400 mg by mouth every 6 (six) hours as needed for headache.   Yes [provider]                                                                                                                                    Past Surgical History Past Surgical History:  Procedure Laterality Date  . TUBAL LIGATION     Family History No family history on file.  Social History Social History   Tobacco Use  . Smoking status: Current Every Day Smoker  Substance Use Topics  . Alcohol use: Yes  . Drug use: Never   Allergies Iodine  Review of Systems Review of Systems All other systems are reviewed and are negative for acute change except as noted in the HPI  Physical Exam Vital Signs  I have reviewed the triage vital signs BP 126/82 (BP  Location: Left Arm)   Pulse 75   Temp 98.4 F (36.9 C) (Oral)   Resp 17   LMP 12/16/2017   SpO2 100%   Physical Exam  Constitutional: She is oriented to person, place, and time. She appears well-developed and well-nourished. No distress.  HENT:  Head: Normocephalic and atraumatic.  Right Ear: External ear normal.  Left Ear: External ear normal.  Nose: Nose normal.  Eyes: Conjunctivae and EOM are normal. No scleral icterus.  Neck: Normal range of motion and phonation normal.  Cardiovascular: Normal rate and regular rhythm.  Pulmonary/Chest: Effort normal. No stridor. No respiratory distress.  Abdominal: She exhibits no distension.  Musculoskeletal: Normal range of motion. She exhibits no edema.  Neurological: She is alert and oriented to person, place, and time.  Mental Status:  Alert and oriented to person, place, and time.  Attention and concentration normal.  Speech clear.  Recent memory is intact  Cranial Nerves:  II Visual Fields: Intact to confrontation. Visual fields intact. III, IV, VI: Pupils equal and reactive to light and near. Full eye movement without nystagmus  V Facial Sensation: Normal. No weakness of masticatory muscles  VII: No facial weakness or asymmetry  VIII Auditory Acuity: Grossly normal  IX/X: The uvula is midline; the palate elevates symmetrically  XI: Normal sternocleidomastoid and trapezius strength  XII: The tongue is midline. No atrophy or fasciculations.   Motor System: Muscle Strength: 5/5 and symmetric in the upper and lower extremities. No pronation or drift.  Muscle Tone: Tone and muscle bulk are normal in the upper and lower extremities.   Reflexes: DTRs: 1+ and symmetrical in all four extremities. No Clonus Coordination: Intact finger-to-nose. No tremor.  Sensation: Intact to light touch, and pinprick.  Gait: Routine gait normal.   Skin: She is not diaphoretic.  Psychiatric: She has a normal mood and affect. Her behavior is normal.    Vitals reviewed.   ED Results and Treatments Labs (all labs ordered are listed, but only abnormal results are displayed) Labs Reviewed - No data to display                                                                                                                       EKG  EKG Interpretation  Date/Time:    Ventricular Rate:    PR Interval:    QRS Duration:   QT Interval:    QTC Calculation:   R Axis:     Text Interpretation:        Radiology No results found. Pertinent labs & imaging results that were available during my care of the patient were reviewed by me and considered in my medical decision making (see chart for details).  Medications Ordered in ED Medications  prochlorperazine (COMPAZINE) tablet 10 mg (10 mg Oral Given 12/24/17 0534)  diphenhydrAMINE (BENADRYL) capsule 25 mg (25 mg Oral Given 12/24/17 0534)  dexamethasone (DECADRON) tablet 10 mg (10 mg Oral Given 12/24/17 0534)                                                                                                                                    Procedures Procedures  (including critical care time)  Medical Decision Making / ED Course I have reviewed the nursing notes for this encounter and the patient's prior records (if available in  EHR or on provided paperwork).    Typical migraine headache for the pt. Non focal neuro exam. No recent head trauma. No fever. Doubt meningitis. Doubt intracranial bleed. Doubt IIH. No indication for imaging. Will treat with oral migraine cocktail and will allow her to rest in dark room at home.  The patient appears reasonably screened and/or stabilized for discharge and I doubt any other medical condition or other [facility] requiring further screening, evaluation, or treatment in the ED at this time prior to discharge.  The patient is safe for discharge with strict return precautions.     Final Clinical Impression(s) / ED Diagnoses Final diagnoses:  Intractable  migraine without aura and with status migrainosus   Disposition: Discharge  Condition: Good  I have discussed the results, Dx and Tx plan with the patient who expressed understanding and agree(s) with the plan. Discharge instructions discussed at great length. The patient was given strict return precautions who verbalized understanding of the instructions. No further questions at time of discharge.    ED Discharge Orders    None       Follow Up: Primary care provider   If you do not have a primary care physician, contact [facility] at [phone] for referral     This chart was dictated using voice recognition software.  Despite best efforts to proofread,  errors can occur which can change the documentation meaning.   Nira Connardama, Brandy Zuba Eduardo, MD 12/24/17 [phone]

## 2019-08-23 ENCOUNTER — Encounter (HOSPITAL_COMMUNITY): Payer: Self-pay

## 2019-08-23 ENCOUNTER — Emergency Department (HOSPITAL_COMMUNITY)
Admission: EM | Admit: 2019-08-23 | Discharge: 2019-08-23 | Disposition: A | Discharge status: Home / Self Care | Payer: Medicaid Other | Attending: Emergency Medicine | Admitting: Emergency Medicine

## 2019-08-23 ENCOUNTER — Emergency Department (HOSPITAL_COMMUNITY): Payer: Medicaid Other

## 2019-08-23 DIAGNOSIS — K8020 Calculus of gallbladder without cholecystitis without obstruction: Secondary | ICD-10-CM | POA: Diagnosis not present

## 2019-08-23 DIAGNOSIS — R112 Nausea with vomiting, unspecified: Secondary | ICD-10-CM

## 2019-08-23 DIAGNOSIS — Z79899 Other long term (current) drug therapy: Secondary | ICD-10-CM | POA: Diagnosis not present

## 2019-08-23 DIAGNOSIS — F17210 Nicotine dependence, cigarettes, uncomplicated: Secondary | ICD-10-CM | POA: Diagnosis not present

## 2019-08-23 DIAGNOSIS — R1013 Epigastric pain: Secondary | ICD-10-CM | POA: Diagnosis present

## 2019-08-23 LAB — COMPREHENSIVE METABOLIC PANEL
ALT: 19 U/L (ref 0–44)
AST: 18 U/L (ref 15–41)
Albumin: 4.8 g/dL (ref 3.5–5.0)
Alkaline Phosphatase: 47 U/L (ref 38–126)
Anion gap: 10 (ref 5–15)
BUN: 11 mg/dL (ref 6–20)
CO2: 28 mmol/L (ref 22–32)
Calcium: 8.8 mg/dL — ABNORMAL LOW (ref 8.9–10.3)
Chloride: 103 mmol/L (ref 98–111)
Creatinine, Ser: 0.58 mg/dL (ref 0.44–1.00)
GFR calc Af Amer: >60 mL/min (ref >60)
GFR calc non Af Amer: >60 mL/min (ref >60)
Glucose, Bld: 116 mg/dL — ABNORMAL HIGH (ref 70–99)
Potassium: 4.1 mmol/L (ref 3.5–5.1)
Sodium: 141 mmol/L (ref 135–145)
Total Bilirubin: 0.9 mg/dL (ref 0.3–1.2)
Total Protein: 7.6 g/dL (ref 6.5–8.1)

## 2019-08-23 LAB — CBC
HCT: 42.5 % (ref 36.0–46.0)
Hemoglobin: 13.8 g/dL (ref 12.0–15.0)
MCH: 28 pg (ref 26.0–34.0)
MCHC: 32.5 g/dL (ref 30.0–36.0)
MCV: 86.2 fL (ref 80.0–100.0)
Platelets: 238 10*3/uL (ref 150–400)
RBC: 4.93 MIL/uL (ref 3.87–5.11)
RDW: 12.9 % (ref 11.5–15.5)
WBC: 10.7 10*3/uL — ABNORMAL HIGH (ref 4.0–10.5)
nRBC: 0 % (ref 0.0–0.2)

## 2019-08-23 LAB — URINALYSIS, ROUTINE W REFLEX MICROSCOPIC
Bilirubin Urine: NEGATIVE
Glucose, UA: NEGATIVE mg/dL
Hgb urine dipstick: NEGATIVE
Ketones, ur: NEGATIVE mg/dL
Leukocytes,Ua: NEGATIVE
Nitrite: NEGATIVE
Protein, ur: NEGATIVE mg/dL
Specific Gravity, Urine: 1.02 (ref 1.005–1.030)
pH: 7 (ref 5.0–8.0)

## 2019-08-23 LAB — I-STAT BETA HCG BLOOD, ED (MC, WL, AP ONLY): I-stat hCG, quantitative: <5 m[IU]/mL (ref <5)

## 2019-08-23 LAB — LIPASE, BLOOD: Lipase: 21 U/L (ref 11–51)

## 2019-08-23 MED ORDER — ONDANSETRON HCL 4 MG/2ML IJ SOLN
4.0000 mg | Freq: Once | INTRAMUSCULAR | Status: AC
  Administered 2019-08-23: 12:00:00 4 mg via INTRAVENOUS
  Filled 2019-08-23: qty 2

## 2019-08-23 MED ORDER — ONDANSETRON 4 MG PO TBDP: 4.0000 mg | ORAL_TABLET | Freq: Three times a day (TID) | ORAL | 0 refills | Status: DC | PRN

## 2019-08-23 MED ORDER — SODIUM CHLORIDE 0.9 % IV BOLUS
1000.0000 mL | Freq: Once | INTRAVENOUS | Status: AC
  Administered 2019-08-23: 12:00:00 1000 mL via INTRAVENOUS

## 2019-08-23 MED ORDER — MORPHINE SULFATE (PF) 4 MG/ML IV SOLN
4.0000 mg | Freq: Once | INTRAVENOUS | Status: AC
  Administered 2019-08-23: 4 mg via INTRAVENOUS
  Filled 2019-08-23: qty 1

## 2019-08-23 MED ORDER — SODIUM CHLORIDE 0.9% FLUSH
3.0000 mL | Freq: Once | INTRAVENOUS | Status: AC
  Administered 2019-08-23: 12:00:00 3 mL via INTRAVENOUS

## 2019-08-23 MED ORDER — FENTANYL CITRATE (PF) 100 MCG/2ML IJ SOLN
50.0000 ug | Freq: Once | INTRAMUSCULAR | Status: AC
Start: 2019-08-23 — End: 2019-08-23
  Administered 2019-08-23: 50 ug via INTRAVENOUS
  Filled 2019-08-23: qty 2

## 2019-08-23 MED ORDER — ONDANSETRON HCL 4 MG/2ML IJ SOLN
4.0000 mg | Freq: Once | INTRAMUSCULAR | Status: AC
  Administered 2019-08-23: 4 mg via INTRAVENOUS
  Filled 2019-08-23: qty 2

## 2019-08-23 MED ORDER — ALUM & MAG HYDROXIDE-SIMETH 200-200-20 MG/5ML PO SUSP
30.0000 mL | Freq: Once | ORAL | Status: AC
  Administered 2019-08-23: 30 mL via ORAL
  Filled 2019-08-23: qty 30

## 2019-08-23 MED ORDER — OXYCODONE-ACETAMINOPHEN 5-325 MG PO TABS: 1.0000 | ORAL_TABLET | Freq: Four times a day (QID) | ORAL | 0 refills | Status: DC | PRN

## 2019-08-23 MED ORDER — LIDOCAINE VISCOUS HCL 2 % MT SOLN
15.0000 mL | Freq: Once | OROMUCOSAL | Status: AC
  Administered 2019-08-23: 12:00:00 15 mL via ORAL
  Filled 2019-08-23: qty 15

## 2019-08-23 NOTE — ED Triage Notes (Signed)
Pt presents with c/o abdominal pain that started this morning. Pt also reports vomiting.

## 2019-08-23 NOTE — ED Provider Notes (Signed)
15:30: Assumed care of patient from [facility] PA-C at change of shift pending re-assessment & disposition.   Please see prior provider note for full H&P.  Briefly patient is a 34 year old female with a history of migraines who presented to the emergency department with abdominal pain and associated nausea and vomiting that began this AM.  Physical Exam  BP 120/71 (BP Location: Right Arm)   Pulse 71   Temp 98.1 F (36.7 C) (Oral)   Resp 14   LMP 08/11/2019 (Approximate)   SpO2 98%   Physical Exam Vitals and nursing note reviewed.  Constitutional:      General: She is not in acute distress.    Appearance: She is well-developed.  HENT:     Head: Normocephalic and atraumatic.  Eyes:     General:        Right eye: No discharge.        Left eye: No discharge.     Conjunctiva/sclera: Conjunctivae normal.  Abdominal:     Tenderness: There is no guarding or rebound.  Neurological:     Mental Status: She is alert.     Comments: Clear speech.   Psychiatric:        Behavior: Behavior normal.        Thought Content: Thought content normal.    ED Course/Procedures     Results for orders placed or performed during the hospital encounter of 08/23/19  Lipase, blood  Result Value Ref Range   Lipase 21 11 - 51 U/L  Comprehensive metabolic panel  Result Value Ref Range   Sodium 141 135 - 145 mmol/L   Potassium 4.1 3.5 - 5.1 mmol/L   Chloride 103 98 - 111 mmol/L   CO2 28 22 - 32 mmol/L   Glucose, Bld 116 (H) 70 - 99 mg/dL   BUN 11 6 - 20 mg/dL   Creatinine, Ser 0.58 0.44 - 1.00 mg/dL   Calcium 8.8 (L) 8.9 - 10.3 mg/dL   Total Protein 7.6 6.5 - 8.1 g/dL   Albumin 4.8 3.5 - 5.0 g/dL   AST 18 15 - 41 U/L   ALT 19 0 - 44 U/L   Alkaline Phosphatase 47 38 - 126 U/L   Total Bilirubin 0.9 0.3 - 1.2 mg/dL   GFR calc non Af Amer >60 >60 mL/min   GFR calc Af Amer >60 >60 mL/min   Anion gap 10 5 - 15  CBC  Result Value Ref Range   WBC 10.7 (H) 4.0 - 10.5 K/uL   RBC 4.93 3.87 - 5.11  MIL/uL   Hemoglobin 13.8 12.0 - 15.0 g/dL   HCT 42.5 36.0 - 46.0 %   MCV 86.2 80.0 - 100.0 fL   MCH 28.0 26.0 - 34.0 pg   MCHC 32.5 30.0 - 36.0 g/dL   RDW 12.9 11.5 - 15.5 %   Platelets 238 150 - 400 K/uL   nRBC 0.0 0.0 - 0.2 %  Urinalysis, Routine w reflex microscopic  Result Value Ref Range   Color, Urine YELLOW YELLOW   APPearance CLEAR CLEAR   Specific Gravity, Urine 1.020 1.005 - 1.030   pH 7.0 5.0 - 8.0   Glucose, UA NEGATIVE NEGATIVE mg/dL   Hgb urine dipstick NEGATIVE NEGATIVE   Bilirubin Urine NEGATIVE NEGATIVE   Ketones, ur NEGATIVE NEGATIVE mg/dL   Protein, ur NEGATIVE NEGATIVE mg/dL   Nitrite NEGATIVE NEGATIVE   Leukocytes,Ua NEGATIVE NEGATIVE  I-Stat beta hCG blood, ED  Result Value Ref Range  I-stat hCG, quantitative <5.0 <5 mIU/mL   Comment 3           US Abdomen Complete  Result Date: 08/23/2019 CLINICAL DATA:  Upper abdominal pain since 5 a.m. EXAM: ABDOMEN ULTRASOUND COMPLETE COMPARISON:  None FINDINGS: Gallbladder: Large gallstone in the gallbladder fundus. A smaller gallstone in the neck of the gallbladder. Largest calculus 2.3 cm. Calculus in the neck of the gallbladder approximately 12 mm. Patient medicated and abdominal distension not allowing for assessment for sonographic Murphy's. Wall thickness at 2 mm. No pericholecystic fluid. Common bile duct: Diameter: 4 mm Liver: No focal lesion identified. Within normal limits in parenchymal echogenicity. Portal vein is patent on color Doppler imaging with normal direction of blood flow towards the liver. IVC: No abnormality visualized. Pancreas: Visualized portion unremarkable. Spleen: Size and appearance within normal limits. Right Kidney: Length: 10.3 cm. Echogenicity within normal limits. No mass or hydronephrosis visualized. Left Kidney: Length: 10.7 cm. Echogenicity within normal limits. No mass or hydronephrosis visualized. Abdominal aorta: No aneurysm visualized. Other findings: None. IMPRESSION: 1.  Cholelithiasis without definitive evidence of acute cholecystitis. Gallstone in the neck of the gallbladder. If there is continued concern for acute cholecystitis HIDA scan may be helpful. Electronically Signed   By: Donzetta Kohut M.D.   On: 08/23/2019 15:01     Procedures  MDM   Prior work-up reviewed, mild leukocytosis noted.  Ultrasound with cholelithiasis without evidence of acute cholecystitis.  General surgery was consulted by prior team, plan for pain control and p.o. challenge, if patient unable to tolerate p.o. or with intractable pain reconsult general surgery, otherwise discharge home with symptomatic care and follow-up in the office.  1630: Reevaluation: Patient states she is feeling improved, will trial p.o. challenge.  15:45: Patient tolerating p.o. in the emergency department, she states she feels much better, she feels comfortable with discharge home at this time.  Will provide diet recommendations, Percocet, and Zofran.  General surgery office information provided as well. I discussed results, treatment plan, need for follow-up, and return precautions with the patient. Provided opportunity for questions, patient confirmed understanding and is in agreement with plan.   18:00: Rediscussed with general surgeon Dr. Michaell Cowing, in agreement with plan.     Cherly Anderson, PA-C 08/23/19 1807    Charlynne Pander, MD 08/27/19 [phone]

## 2019-08-23 NOTE — Discharge Instructions (Addendum)
You were seen in the ER today for abdominal pain Your labs were overall reassuring. Your ultrasound showed findings of gallstones which we suspect are contributing to your pain and vomiting.  We have provided additional information on gallstones as well as diet recommendations in regards to this.  We are sending you home with the following medicines: -Zofran: Take 3 hours as needed for nausea and vomiting -Percocet-this is a narcotic/controlled substance medication that has potential addicting qualities.  We recommend that you take 1-2 tablets every 6 hours as needed for severe pain.  Do not drive or operate heavy machinery when taking this medicine as it can be sedating. Do not drink alcohol or take other sedating medications when taking this medicine for safety reasons.  Keep this out of reach of small children.  Please be aware this medicine has Tylenol in it (325 mg/tab) do not exceed the maximum dose of Tylenol in a day per over the counter recommendations should you decide to supplement with Tylenol over the counter.   We have prescribed you new medication(s) today. Discuss the medications prescribed today with your pharmacist as they can have adverse effects and interactions with your other medicines including over the counter and prescribed medications. Seek medical evaluation if you start to experience new or abnormal symptoms after taking one of these medicines, seek care immediately if you start to experience difficulty breathing, feeling of your throat closing, facial swelling, or rash as these could be indications of a more serious allergic reaction  Please follow-up with [facility] surgery by calling the office first thing tomorrow morning to set up a follow-up appointment within the next 3 days.  Return to the ER for new or worsening symptoms including but not limited to worsening pain, pain/nausea/vomiting not controlled by medications you were sent home with, inability to keep  fluids down, fever, or any other concerns.

## 2019-08-23 NOTE — ED Provider Notes (Signed)
[facility] DEPT Provider Note   CSN: [id number] Arrival date & time: 08/23/19  1022     History Chief Complaint  Patient presents with  . Abdominal Pain    Julie Briggs is a 34 y.o. female.  34 y.o female with a PMH of Migraines presents to the ED with a chief complaint of nausea, vomiting and abdominal pain x 6 hours. Patient reports eating shrimp cocktail last night around 10 pm, her symptoms began while waking up this morning. Reports several episodes of bilious emesis, since this morning. Attempted to take Tums, Pepto-Bismol, chamomile tea and proceeded to vomit all of this.  Pain is exacerbated with movement, exacerbated with palpation of the epigastric region.  Last menstrual period was at the end of March, without any abnormalities.  Reports the pain is cramping sensation to the epigastric region, reports this is intermittent, has a previous history of reflux, currently on omeprazole which she attempted to take today but unable to keep this down.  No diarrhea, fever, prior history of kidney stones.   Abdominal Pain Pain location:  Epigastric Pain quality: cramping   Pain radiates to:  Does not radiate Pain severity:  Severe Associated symptoms: nausea and vomiting   Associated symptoms: no chest pain, no diarrhea, no fever, no shortness of breath and no sore throat        Past Medical History:  Diagnosis Date  . Migraine     There are no problems to display for this patient.   Past Surgical History:  Procedure Laterality Date  . TUBAL LIGATION       OB History   No obstetric history on file.     History reviewed. No pertinent family history.  Social History   Tobacco Use  . Smoking status: Current Every Day Smoker  Substance Use Topics  . Alcohol use: Yes  . Drug use: Never    Home Medications Prior to Admission medications   Medication Sig Start Date End Date Taking? Authorizing Provider  FEROSUL 325  (65 Fe) MG tablet Take 325 mg by mouth daily. 08/12/19  Yes [provider]  furosemide (LASIX) 20 MG tablet Take 20 mg by mouth daily. 08/12/19  Yes [provider]  ketoconazole (NIZORAL) 2 % cream Apply 1 application topically daily. 08/12/19 08/25/19 Yes [provider]  meloxicam (MOBIC) 15 MG tablet Take 15 mg by mouth daily as needed for pain.  08/12/19  Yes [provider]  NYSTATIN powder Apply 1 application topically 3 (three) times daily.  08/12/19 08/25/19 Yes [provider]  omeprazole (PRILOSEC) 20 MG capsule Take 20 mg by mouth daily. 08/12/19  Yes [provider]  RESTASIS 0.05 % ophthalmic emulsion Place 1 drop into both eyes 2 (two) times daily. 06/29/19  Yes [provider]  Vitamin D, Ergocalciferol, (DRISDOL) 1.25 MG (50000 UNIT) CAPS capsule Take 50,000 Units by mouth once a week.  08/12/19  Yes [provider]  amoxicillin-clavulanate (AUGMENTIN) 875-125 MG tablet Take 1 tablet by mouth every 12 (twelve) hours. Patient not taking: Reported on 08/23/2019 06/21/16   Carmin Muskrat, MD  clindamycin (CLEOCIN) 150 MG capsule Take 2 capsules (300 mg total) by mouth 3 (three) times daily. Patient not taking: Reported on 08/23/2019 04/26/16   Ashley Murrain, NP  dextromethorphan 15 MG/5ML syrup Take 10 mLs (30 mg total) by mouth 4 (four) times daily as needed for cough. Patient not taking: Reported on 08/23/2019 06/21/16   Carmin Muskrat, MD  methocarbamol (ROBAXIN) 500 MG tablet Take 1 tablet (500 mg total) by mouth 2 (two) times daily. Patient not taking: Reported on 08/23/2019 07/22/15   Barrett, Rolm Gala, PA-C  naproxen (NAPROSYN) 500 MG tablet Take 1 tablet (500 mg total) by mouth 2 (two) times daily as needed. Patient not taking: Reported on 08/23/2019 10/22/15   Ward, Chase Picket, PA-C  traMADol (ULTRAM) 50 MG tablet Take 1 tablet (50 mg total) by mouth every 6 (six) hours as needed. Patient not taking: Reported on  08/23/2019 04/26/16   Janne Napoleon, NP    Allergies    Iodine  Review of Systems   Review of Systems  Constitutional: Negative for fever.  HENT: Negative for sore throat.   Respiratory: Negative for shortness of breath.   Cardiovascular: Negative for chest pain.  Gastrointestinal: Positive for abdominal pain, nausea and vomiting. Negative for blood in stool and diarrhea.  Genitourinary: Negative for difficulty urinating and flank pain.  Musculoskeletal: Negative for back pain.  Skin: Negative for pallor and wound.  Neurological: Negative for dizziness and light-headedness.  All other systems reviewed and are negative.   Physical Exam Updated Vital Signs BP 120/71 (BP Location: Right Arm)   Pulse 71   Temp 98.1 F (36.7 C) (Oral)   Resp 14   LMP 08/11/2019 (Approximate)   SpO2 98%   Physical Exam Vitals and nursing note reviewed.  Constitutional:      Appearance: She is well-developed.  HENT:     Head: Normocephalic and atraumatic.  Cardiovascular:     Rate and Rhythm: Normal rate.  Pulmonary:     Effort: Pulmonary effort is normal.     Breath sounds: No wheezing, rhonchi or rales.  Abdominal:     General: Abdomen is flat. Bowel sounds are decreased.     Palpations: Abdomen is soft.     Tenderness: There is abdominal tenderness in the right upper quadrant, epigastric area and left upper quadrant. There is guarding. There is no right CVA tenderness, left CVA tenderness or rebound. Negative signs include Murphy's sign and Rovsing's sign.     Hernia: No hernia is present.  Skin:    General: Skin is warm and dry.  Neurological:     Mental Status: She is alert and oriented to person, place, and time.     ED Results / Procedures / Treatments   Labs (all labs ordered are listed, but only abnormal results are displayed) Labs Reviewed  COMPREHENSIVE METABOLIC PANEL - Abnormal; Notable for the following components:      Result Value   Glucose, Bld 116 (*)    Calcium  8.8 (*)    All other components within normal limits  CBC - Abnormal; Notable for the following components:   WBC 10.7 (*)    All other components within normal limits  LIPASE, BLOOD  URINALYSIS, ROUTINE W REFLEX MICROSCOPIC  I-STAT BETA HCG BLOOD, ED ([facility], [facility], [facility] ONLY)    EKG None  Radiology US Abdomen Complete  Result Date: 08/23/2019 CLINICAL DATA:  Upper abdominal pain since 5 a.m. EXAM: ABDOMEN ULTRASOUND COMPLETE COMPARISON:  None FINDINGS: Gallbladder: Large gallstone in the gallbladder fundus. A smaller gallstone in the neck of the gallbladder. Largest calculus 2.3 cm. Calculus in the neck of the gallbladder approximately 12 mm. Patient medicated and abdominal distension not allowing for assessment for sonographic Murphy's. Wall thickness at 2 mm. No pericholecystic fluid. Common bile duct: Diameter: 4 mm Liver: No focal lesion identified. Within normal limits in parenchymal  echogenicity. Portal vein is patent on color Doppler imaging with normal direction of blood flow towards the liver. IVC: No abnormality visualized. Pancreas: Visualized portion unremarkable. Spleen: Size and appearance within normal limits. Right Kidney: Length: 10.3 cm. Echogenicity within normal limits. No mass or hydronephrosis visualized. Left Kidney: Length: 10.7 cm. Echogenicity within normal limits. No mass or hydronephrosis visualized. Abdominal aorta: No aneurysm visualized. Other findings: None. IMPRESSION: 1. Cholelithiasis without definitive evidence of acute cholecystitis. Gallstone in the neck of the gallbladder. If there is continued concern for acute cholecystitis HIDA scan may be helpful. Electronically Signed   By: Donzetta Kohut M.D.   On: 08/23/2019 15:01    Procedures Procedures (including critical care time)  Medications Ordered in ED Medications  morphine 4 MG/ML injection 4 mg (has no administration in time range)  sodium chloride flush (NS) 0.9 % injection 3 mL (3 mLs Intravenous Given  08/23/19 1135)  sodium chloride 0.9 % bolus 1,000 mL (0 mLs Intravenous Stopped 08/23/19 1324)  ondansetron (ZOFRAN) injection 4 mg (4 mg Intravenous Given 08/23/19 1135)  fentaNYL (SUBLIMAZE) injection 50 mcg (50 mcg Intravenous Given 08/23/19 1135)  alum & mag hydroxide-simeth (MAALOX/MYLANTA) 200-200-20 MG/5ML suspension 30 mL (30 mLs Oral Given 08/23/19 1142)    And  lidocaine (XYLOCAINE) 2 % viscous mouth solution 15 mL (15 mLs Oral Given 08/23/19 1142)  ondansetron (ZOFRAN) injection 4 mg (4 mg Intravenous Given 08/23/19 1337)  morphine 4 MG/ML injection 4 mg (4 mg Intravenous Given 08/23/19 1337)    ED Course  I have reviewed the triage vital signs and the nursing notes.  Pertinent labs & imaging results that were available during my care of the patient were reviewed by me and considered in my medical decision making (see chart for details).    MDM Rules/Calculators/A&P     Patient with a past medical history of migraines presents to the ED with complaints of severe epigastric pain which began 6 hours ago.  Does have a prior history of reflux, currently on omeprazole, reports taking over-the-counter medication such as Tums, Pepto-Bismol,, milk tea but has been unable to retain any liquids since this morning.  Her last meal was last night, consisted of shrimp cocktail.  Has not been running any fevers, urinary symptoms, diarrhea or blood in her stool.  Will double episodes of nonbilious, nonbloody emesis.  Arrived in the ED in severe discomfort, vitals are within normal limits.  Physical exam remarkable for severe epigastric tenderness, right upper quadrant tenderness as well.  Lungs are clear to auscultation.  No bilateral pitting edema, she does report she is currently on Lasix for fluid retention, no prior history of heart failure.  Last menstrual period was at the end of last month, normal in nature, denies any concerns for sexually transmitted infections or pregnancy.  Interpretation of  her labs by me CBC with mild leukocytosis, hemoglobin is within normal limits. CMP without electrolyte abnormality. Creatine is within normal limits, no urinary symptoms, no fever, no CVA tenderness lower suspicion for renal etiology. LFT's are unremarkable, significant tenderness along the epigastric region, some suspicion for cholelithiasis. Lipase level is within normal limits. UA is beningn.  Patient was given additional medication for pain control as there was no improvement with fentanyl, given morphine for pain control.  Ultrasound of her gallbladder showed: Large gallstone in the gallbladder fundus. A smaller  gallstone in the neck of the gallbladder. Largest calculus 2.3 cm.  Calculus in the neck of the gallbladder approximately 12 mm.  3:18 PM patient reevaluated by me, does report improvement in pain.  Does report pain returns if she moves, has not attempted any p.o. at this time.  Discussed the possibility of call out to general surgery to discuss ultrasound.  Patient is agreeable of this at this time, consult to general surgery placed.  3:27 PM Spoke to Dr. Michaell Cowing, who recommended PO challenge and close follow up if this is tolerable.  Versus fails PO and will be coming in to hospital for cholecystectomy.   Patient care signed out to Sam P. PA at shift change.   Portions of this note were generated with Scientist, clinical (histocompatibility and immunogenetics). Dictation errors may occur despite best attempts at proofreading.  Final Clinical Impression(s) / ED Diagnoses Final diagnoses:  Epigastric pain  Non-intractable vomiting with nausea, unspecified vomiting type    Rx / DC Orders ED Discharge Orders    None       Claude Manges, PA-C 08/23/19 1532    Bethann Berkshire, MD 08/24/19 1029

## 2019-09-09 ENCOUNTER — Other Ambulatory Visit: Payer: Self-pay

## 2019-09-09 ENCOUNTER — Ambulatory Visit (INDEPENDENT_AMBULATORY_CARE_PROVIDER_SITE_OTHER): Payer: Medicaid Other

## 2019-09-09 ENCOUNTER — Ambulatory Visit: Payer: Medicaid Other | Admitting: Podiatry

## 2019-09-09 DIAGNOSIS — B353 Tinea pedis: Secondary | ICD-10-CM

## 2019-09-09 DIAGNOSIS — M7661 Achilles tendinitis, right leg: Secondary | ICD-10-CM

## 2019-09-09 MED ORDER — TERBINAFINE HCL 250 MG PO TABS: 250.0000 mg | ORAL_TABLET | Freq: Every day | ORAL | 0 refills | Status: DC

## 2019-09-09 MED ORDER — CLOTRIMAZOLE-BETAMETHASONE 1-0.05 % EX CREA: 1.0000 "application " | TOPICAL_CREAM | Freq: Two times a day (BID) | CUTANEOUS | 1 refills | Status: DC

## 2019-09-09 MED ORDER — METHYLPREDNISOLONE 4 MG PO TBPK: ORAL_TABLET | ORAL | 0 refills | Status: DC

## 2019-09-11 NOTE — Progress Notes (Signed)
   HPI: 34 y.o. female presenting today as a new patient with a chief complaint of sharp pain to the right heel and Achilles area that began about 4 months ago. She states the pain is worse in the morning when she first gets out of bed and has difficulty bearing weight at that time. She states the pain eases throughout the day as she moves it around more. Resting the foot increases the pain. She has been taking Meloxicam she received from her PCP for treatment. Patient is here for further evaluation and treatment.   Past Medical History:  Diagnosis Date  . Migraine       Physical Exam: General: The patient is alert and oriented x3 in no acute distress.  Dermatology: Pruritus noted to the bilateral feet with hyperkeratosis. Skin is warm, dry and supple bilateral lower extremities. Negative for open lesions or macerations.  Vascular: Palpable pedal pulses bilaterally. No edema or erythema noted. Capillary refill within normal limits.  Neurological: Epicritic and protective threshold grossly intact bilaterally.   Musculoskeletal Exam: Pain on palpation noted to the posterior tubercle of the right calcaneus at the insertion of the Achilles tendon consistent with retrocalcaneal bursitis. Range of motion within normal limits. Muscle strength 5/5 in all muscle groups bilateral lower extremities.  Radiographic Exam:  Posterior calcaneal spur noted to the respective calcaneus on lateral view. No fracture or dislocation noted. Normal osseous mineralization noted.     Assessment: 1. Insertional Achilles tendinitis right 2. Retrocalcaneal bursitis 3. Tinea pedis bilateral    Plan of Care:  1. Patient was evaluated. Radiographs were reviewed today. 2. Injection of 0.5 mL Celestone Soluspan injected into the retrocalcaneal bursa. Care was taken to avoid direct injection into the Achilles tendon. 3. CAM boot dispensed.  4. Prescription for Medrol Dose Pak provided to patient. Then resume taking  Meloxicam.  5. Prescription for Lamisil 250 mg #28 provided to patient.  6. Prescription for Lotrisone cream provided to patient.  7. Physical therapy ordered at [facility].  8. Return to clinic in 4 weeks.   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    Felecia Shelling, DPM [facility]  Dr. Felecia Shelling, DPM    [address]                                        [place], [place] [address]                Office [phone]  Fax [phone]

## 2019-10-06 ENCOUNTER — Telehealth: Payer: Self-pay | Admitting: *Deleted

## 2019-10-06 DIAGNOSIS — M7661 Achilles tendinitis, right leg: Secondary | ICD-10-CM

## 2019-10-06 NOTE — Telephone Encounter (Signed)
[facility] - Julie Briggs states pt called and was seen several weeks ago and the doctor mentioned PT and now she is ready to schedule. Orders to [facility] - in-office.

## 2019-10-06 NOTE — Telephone Encounter (Signed)
[facility] - Florentina Addison states has question about pt's scheduling.

## 2019-10-06 NOTE — Telephone Encounter (Signed)
Pt has Medicaid and is not accepted by [facility]. Referral faxed to [facility].

## 2019-10-06 NOTE — Addendum Note (Signed)
Addended by: Alphia Kava D on: 10/06/2019 11:00 AM   Modules accepted: Orders

## 2019-10-07 ENCOUNTER — Other Ambulatory Visit: Payer: Self-pay

## 2019-10-07 ENCOUNTER — Ambulatory Visit (INDEPENDENT_AMBULATORY_CARE_PROVIDER_SITE_OTHER): Payer: Medicaid Other | Admitting: Podiatry

## 2019-10-07 ENCOUNTER — Ambulatory Visit: Payer: Medicaid Other | Attending: Podiatry | Admitting: Physical Therapy

## 2019-10-07 ENCOUNTER — Encounter: Payer: Self-pay | Admitting: Podiatry

## 2019-10-07 ENCOUNTER — Encounter: Payer: Self-pay | Admitting: Physical Therapy

## 2019-10-07 DIAGNOSIS — B351 Tinea unguium: Secondary | ICD-10-CM | POA: Diagnosis not present

## 2019-10-07 DIAGNOSIS — M7661 Achilles tendinitis, right leg: Secondary | ICD-10-CM

## 2019-10-07 DIAGNOSIS — R2689 Other abnormalities of gait and mobility: Secondary | ICD-10-CM | POA: Diagnosis present

## 2019-10-07 DIAGNOSIS — M25671 Stiffness of right ankle, not elsewhere classified: Secondary | ICD-10-CM | POA: Diagnosis present

## 2019-10-07 DIAGNOSIS — M25571 Pain in right ankle and joints of right foot: Secondary | ICD-10-CM | POA: Insufficient documentation

## 2019-10-07 DIAGNOSIS — B353 Tinea pedis: Secondary | ICD-10-CM

## 2019-10-07 DIAGNOSIS — M6281 Muscle weakness (generalized): Secondary | ICD-10-CM | POA: Diagnosis present

## 2019-10-07 MED ORDER — CLOTRIMAZOLE-BETAMETHASONE 1-0.05 % EX CREA: 1.0000 "application " | TOPICAL_CREAM | Freq: Two times a day (BID) | CUTANEOUS | 1 refills | Status: DC

## 2019-10-07 MED ORDER — TERBINAFINE HCL 250 MG PO TABS: 250.0000 mg | ORAL_TABLET | Freq: Every day | ORAL | 0 refills | Status: AC

## 2019-10-07 MED ORDER — MELOXICAM 15 MG PO TABS: 15.0000 mg | ORAL_TABLET | Freq: Every day | ORAL | 1 refills | Status: DC | PRN

## 2019-10-07 NOTE — Therapy (Signed)
[place], [place], [address] Phone: [phone]   Fax:  [phone]  Physical Therapy Evaluation  Patient Details  Name: Julie Briggs MRN: [id number] Date of Birth: February 14, 1986 Referring Provider (PT): Edrick Kins, [place]   Encounter Date: 10/07/2019  PT End of Session - 10/07/19 1544    Visit Number  1    Number of Visits  8    Date for PT Re-Evaluation  12/02/19    Authorization Type  MCD    PT Start Time  1530    PT Stop Time  1614    PT Time Calculation (min)  44 min    Activity Tolerance  Patient tolerated treatment well    Behavior During Therapy  [facility] for tasks assessed/performed       Past Medical History:  Diagnosis Date  . Migraine     Past Surgical History:  Procedure Laterality Date  . TUBAL LIGATION      There were no vitals filed for this visit.   Subjective Assessment - 10/07/19 1533    Subjective  Patient reports she had been having a lot of pain in the back of her right ankle that has been going on since the end of last year. She has been in a boot for 4 weeks but saw her doctor to day and she was instructed to stop wearing the boot. Currently she is a little sore because she has been out and about. Her pain is located to the back of the heel and she really only notices it when she is walking around a lot especially when standing a lot at work. States that she doesn't notice it too much when she is moving but it does bother her when she stops moving. She notes that mornings after a long day at work are tough and she does have a lot of discomfort going down the stairs.    Pertinent History  Chronic right ankle sprains    Limitations  Standing;Walking;House hold activities    How long can you sit comfortably?  No limitation    How long can you stand comfortably?  Patient can stand as long as she wants, will have pain depending on amount    How long can you walk  comfortably?  Patient can walk as long as she wants, will have pain depending on amount    Diagnostic tests  X-ray    Patient Stated Goals  Get ankle feeling better so she can have full function    Currently in Pain?  Yes    Pain Score  4     Pain Location  Heel    Pain Orientation  Right;Posterior    Pain Descriptors / Indicators  Throbbing    Pain Type  Chronic pain    Pain Onset  More than a month ago    Pain Frequency  Intermittent    Aggravating Factors   Walking, standing, stairs    Pain Relieving Factors  Rest, medication    Effect of Pain on Daily Activities  Patient has trouble when walking or standing a lot         [facility] PT Assessment - 10/07/19 0001      Assessment   Medical Diagnosis  Right achilles tendonitis    Referring Provider (PT)  Edrick Kins, DPM    Onset Date/Surgical Date  --   > 6 months ago   Hand Dominance  Right    Next  MD Visit  11/18/2019    Prior Therapy  None      Precautions   Precautions  None      Restrictions   Weight Bearing Restrictions  No      Balance Screen   Has the patient fallen in the past 6 months  No    Has the patient had a decrease in activity level because of a fear of falling?   No    Is the patient reluctant to leave their home because of a fear of falling?   No      Home Environment   Living Environment  Private residence    Living Arrangements  Children    Type of Home  House    Home Access  Stairs to enter    Entrance Stairs-Number of Steps  12      Prior Function   Level of Independence  Independent    Vocation  Full time employment    Vocation Requirements  Patient works at [employer]  Patient tries to get out and walk, she has 4 kids, reading      Cognition   Overall Cognitive Status  Within Functional Limits for tasks assessed      Observation/Other Assessments   Observations  Patient appears in no apparent distress    Focus on Therapeutic Outcomes (FOTO)   NA - MCD      Sensation   Light  Touch  Appears Intact      Functional Tests   Functional tests  Single leg stance      Single Leg Stance   Comments  Patient exhibits greater difficulty and less ankle stability on right with eyes closed      Posture/Postural Control   Posture/Postural Control  No significant limitations      ROM / Strength   AROM / PROM / Strength  AROM;PROM;Strength      AROM   AROM Assessment Site  Ankle    Right/Left Ankle  Right;Left    Right Ankle Dorsiflexion  0    Right Ankle Plantar Flexion  55    Right Ankle Inversion  50    Right Ankle Eversion  20    Left Ankle Dorsiflexion  3    Left Ankle Plantar Flexion  55    Left Ankle Inversion  45    Left Ankle Eversion  15      Strength   Overall Strength Comments  Increased pain with right plantarflexion    Strength Assessment Site  Ankle    Right/Left Ankle  Right;Left    Right Ankle Dorsiflexion  5/5    Right Ankle Plantar Flexion  4-/5    Right Ankle Inversion  4+/5    Right Ankle Eversion  4+/5    Left Ankle Dorsiflexion  5/5    Left Ankle Plantar Flexion  5/5    Left Ankle Inversion  5/5    Left Ankle Eversion  5/5      Flexibility   Soft Tissue Assessment /Muscle Length  yes   right gastrocsoleus limitation     Palpation   Palpation comment  TTP to right achilles insertion and along achilles tendon, trigger points noted right calf      Special Tests   Other special tests  Not performed      Transfers   Transfers  Independent with all Transfers      Ambulation/Gait   Ambulation/Gait  Yes    Ambulation/Gait Assistance  7: Independent    Gait Comments  Mildly antalgic on right with decreased toe off                  Objective measurements completed on examination: See above findings.      [facility] Adult PT Treatment/Exercise - 10/07/19 0001      Exercises   Exercises  Ankle      Ankle Exercises: Standing   SLS  x30 sec with EC    Heel Raises  10 reps      Ankle Exercises: Supine   T-Band  Banded  DF, inv, ev with green x15 each    Other Supine Ankle Exercises  Self TPR using tennis ball to right calf             PT Education - 10/07/19 1543    Education Details  Exam findings, POC, HEP    Person(s) Educated  Patient    Methods  Explanation;Demonstration;Tactile cues;Verbal cues;Handout    Comprehension  Verbalized understanding;Returned demonstration;Verbal cues required;Tactile cues required;Need further instruction       PT Short Term Goals - 10/07/19 1751      PT SHORT TERM GOAL #1   Title  Patient will be I with initial HEP to progress with PT    Baseline  HEP provided at evaluation    Time  4    Period  Weeks    Status  New    Target Date  11/04/19      PT SHORT TERM GOAL #2   Title  Patient will report </= 4/10 pain level with standing and/or walking at work to improve activity tolerance    Baseline  7/10    Time  4    Period  Weeks    Status  New    Target Date  11/04/19      PT SHORT TERM GOAL #3   Title  Patient will exhibit right ankle DF AROM equal to opposite side to improve gait mechanics and walking    Baseline  Right: 0 deg, Left: 3 deg    Time  4    Period  Weeks    Status  New    Target Date  11/04/19        PT Long Term Goals - 10/07/19 1754      PT LONG TERM GOAL #1   Title  Patient will be I with final HEP to maintain progress from PT    Time  8    Period  Weeks    Status  New    Target Date  12/02/19      PT LONG TERM GOAL #2   Title  Patient will report </= 1/10 pain level with standing and walking while at work    Time  8    Period  Weeks    Status  New    Target Date  12/02/19      PT LONG TERM GOAL #3   Title  Patient will report no difficulty with descending stairs to improve mobility    Time  8    Period  Weeks    Status  New    Target Date  12/02/19      PT LONG TERM GOAL #4   Title  Patient will exhibit 5/5 MMT strength of right ankle PF to improve activity tolerance and return to prior level of function     Time  8    Period  Weeks  Status  New    Target Date  12/02/19             Plan - 10/07/19 1743    Clinical Impression Statement  Patient presents to PT with report of right achilles tendinopathy that has been ongoing for about half a year. She does exhibit tenderness to the right achilles tendon and insertion, weakess of right plantarflexion with increased discomfort of achilles, pain with extended periods of walking and descending stairs. Her symptoms do seem consistent with what seem like insertional and mid portion tendinopathy. She was provided exercises to initiate achilles/calf strengthening to improve tolerance, ankle strengthening and stability exercises, and self massage to reduce tension on achilles. She would benefit from continued skilled PT to progress her strength and activity tolerance so she can stand and walk without limitation.    Personal Factors and Comorbidities  Time since onset of injury/illness/exacerbation    Examination-Activity Limitations  Locomotion Level;Stairs;Stand    Examination-Participation Restrictions  Community Activity;Shop    Stability/Clinical Decision Making  Stable/Uncomplicated    Clinical Decision Making  Low    Rehab Potential  Good    PT Frequency  1x / week    PT Duration  8 weeks    PT Treatment/Interventions  ADLs/Self Care Home Management;Cryotherapy;Electrical Stimulation;Iontophoresis 4mg /ml Dexamethasone;Moist Heat;Ultrasound;Neuromuscular re-education;Balance training;Therapeutic exercise;Therapeutic activities;Functional mobility training;Stair training;Gait training;Patient/family education;Manual techniques;Dry needling;Passive range of motion;Taping;Joint Manipulations    PT Next Visit Plan  Assess HEP and progress PRN, manual for right calf, progress heel raises, ankle strengthening and stability, possible k-tape, initiate hip strengthening    PT Home Exercise Plan  : DL heel raises, SL balance with EC, banded ankle  DF/inv/ev with green, calf self TPR using ball    Consulted and Agree with Plan of Care  Patient       Patient will benefit from skilled therapeutic intervention in order to improve the following deficits and impairments:  Pain, Decreased activity tolerance, Decreased strength, Decreased balance, Abnormal gait  Visit Diagnosis: Pain in right ankle and joints of right foot  Muscle weakness (generalized)  Stiffness of right ankle, not elsewhere classified  Other abnormalities of gait and mobility     Problem List There are no problems to display for this patient.   [id number], PT, DPT, LAT, ATC 10/07/19  6:04 PM Phone: [phone] Fax: [phone]   [facility] [address] [place], [place], [place] Phone: [phone]   Fax:  [phone]  Name: Lakeria Starkman MRN: Shelly Flatten Date of Birth: 24-Aug-1985

## 2019-10-07 NOTE — Patient Instructions (Signed)
Access Code: [email or web address] URL: [email or web address] Date: 10/07/2019 Prepared by: Rosana Hoes  Exercises Standing Heel Raise - 1 x daily - 7 x weekly - 3 sets - 10 reps Single Leg Balance with Eyes Closed - 1 x daily - 7 x weekly - 3 reps - 30 seconds hold Long Sitting Ankle Dorsiflexion with Anchored Resistance - 1 x daily - 7 x weekly - 2 sets - 15 reps Ankle Eversion with Resistance - 1 x daily - 7 x weekly - 2 sets - 15 reps Ankle Inversion with Resistance - 1 x daily - 7 x weekly - 2 sets - 15 reps Calf Mobilization with Small Ball on Yoga Block - 7 x weekly

## 2019-10-09 ENCOUNTER — Other Ambulatory Visit: Payer: Self-pay | Admitting: Physician Assistant

## 2019-10-09 DIAGNOSIS — K8070 Calculus of gallbladder and bile duct without cholecystitis without obstruction: Secondary | ICD-10-CM

## 2019-10-09 NOTE — Progress Notes (Signed)
   HPI: 34 y.o. female presenting today for follow up evaluation of insertional Achilles tendinitis of the right foot and tinea pedis bilaterally. She states she is doing well. She reports an increase in activity and walking with no increase in pain. She has been completed the oral medications as directed and continues to apply Lotrisone cream. She has been using the CAM boot and going to physical therapy as instructed. Patient is here for further evaluation and treatment.   Past Medical History:  Diagnosis Date  . Migraine       Physical Exam: General: The patient is alert and oriented x3 in no acute distress.  Dermatology: Pruritus noted to the bilateral feet with hyperkeratosis. Hyperkeratotic, discolored, thickened, onychodystrophy noted to nails 1-5 bilaterally. Skin is warm, dry and supple bilateral lower extremities. Negative for open lesions or macerations.  Vascular: Palpable pedal pulses bilaterally. No edema or erythema noted. Capillary refill within normal limits.  Neurological: Epicritic and protective threshold grossly intact bilaterally.   Musculoskeletal Exam: Pain on palpation noted to the posterior tubercle of the right calcaneus at the insertion of the Achilles tendon consistent with retrocalcaneal bursitis. Range of motion within normal limits. Muscle strength 5/5 in all muscle groups bilateral lower extremities.  Assessment: 1. Insertional Achilles tendinitis right 2. Retrocalcaneal bursitis 3. Tinea pedis bilateral  4. Onychomycosis nails 1-5 bilateral   Plan of Care:  1. Patient was evaluated. 2. Discontinue using CAM boot. Recommended good shoe gear.  3. Recommended stretching exercises.  4. Refill prescription for Meloxicam, Lotrisone cream and Lamisil 250 mg #60 provided to patient.  5. Return to clinic in 6 weeks.    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    Felecia Shelling, DPM [facility]  Dr. Felecia Shelling, DPM    [address]                                         [place], [place] [address]                Office [phone]  Fax [phone]

## 2019-10-16 ENCOUNTER — Other Ambulatory Visit: Payer: Self-pay | Admitting: Physician Assistant

## 2019-10-19 ENCOUNTER — Ambulatory Visit
Admission: RE | Admit: 2019-10-19 | Discharge: 2019-10-19 | Disposition: A | Discharge status: Home / Self Care | Payer: Medicaid Other | Source: Ambulatory Visit | Attending: Physician Assistant | Admitting: Physician Assistant

## 2019-10-19 DIAGNOSIS — K8070 Calculus of gallbladder and bile duct without cholecystitis without obstruction: Secondary | ICD-10-CM

## 2019-10-21 ENCOUNTER — Ambulatory Visit: Payer: Medicaid Other | Attending: Podiatry | Admitting: Physical Therapy

## 2019-10-21 ENCOUNTER — Encounter: Payer: Self-pay | Admitting: Physical Therapy

## 2019-10-21 ENCOUNTER — Other Ambulatory Visit: Payer: Self-pay

## 2019-10-21 DIAGNOSIS — M25671 Stiffness of right ankle, not elsewhere classified: Secondary | ICD-10-CM | POA: Diagnosis present

## 2019-10-21 DIAGNOSIS — M25571 Pain in right ankle and joints of right foot: Secondary | ICD-10-CM | POA: Insufficient documentation

## 2019-10-21 DIAGNOSIS — R2689 Other abnormalities of gait and mobility: Secondary | ICD-10-CM

## 2019-10-21 DIAGNOSIS — M6281 Muscle weakness (generalized): Secondary | ICD-10-CM | POA: Diagnosis present

## 2019-10-21 NOTE — Patient Instructions (Signed)
Access Code: [email or web address] URL: [email or web address] Date: 10/21/2019 Prepared by: Rosana Hoes  Exercises Heel Raise on Step - 1 x daily - 3 sets - 10 reps Single Leg Balance with Eyes Closed - 1 x daily - 3 reps - 30 seconds hold Long Sitting Ankle Dorsiflexion with Anchored Resistance - 1 x daily - 2 sets - 15 reps Ankle Eversion with Resistance - 1 x daily - 2 sets - 15 reps Ankle Inversion with Resistance - 1 x daily - 2 sets - 15 reps Calf Mobilization with Small Ball on Yoga Block Sidelying Hip Abduction - 1 x daily - 3 sets - 10 reps

## 2019-10-21 NOTE — Therapy (Addendum)
[place], [place], [address] Phone: [phone]   Fax:  [phone]  Physical Therapy Treatment / Discharge  Patient Details  Name: Julie Briggs MRN: [id number] Date of Birth: 1986-04-30 Referring Provider (PT): Edrick Kins, [place]   Encounter Date: 10/21/2019  PT End of Session - 10/21/19 1716    Visit Number  2    Number of Visits  8    Date for PT Re-Evaluation  12/02/19    Authorization Type  MCD    PT Start Time  1711   patient arrived late   PT Stop Time  1745    PT Time Calculation (min)  34 min    Activity Tolerance  Patient tolerated treatment well    Behavior During Therapy  [facility] for tasks assessed/performed       Past Medical History:  Diagnosis Date  . Migraine     Past Surgical History:  Procedure Laterality Date  . TUBAL LIGATION      There were no vitals filed for this visit.  Subjective Assessment - 10/21/19 1714    Subjective  Patient reports things have been hectic at work so she has had some pain. She has been consistent with her exercises and she does feel like they are helping because her pain has been less. She has been wearing compression wrap when in pain that has helped.    Patient Stated Goals  Get ankle feeling better so she can have full function    Currently in Pain?  No/denies    Pain Score  0-No pain    Pain Location  Heel    Pain Orientation  Right;Posterior                        [facility] Adult PT Treatment/Exercise - 10/21/19 0001      Exercises   Exercises  Ankle      Ankle Exercises: Stretches   Slant Board Stretch  3 reps;30 seconds      Ankle Exercises: Standing   Rebounder  SL balance on Airex 2x10    Heel Raises  10 reps   3 sets   Heel Raises Limitations  edge of step, slow and controlled      Ankle Exercises: Sidelying   Other Sidelying Ankle Exercises  Hip abduction 3x10             PT Education -  10/21/19 1715    Education Details  HEP    Person(s) Educated  Patient    Methods  Explanation;Demonstration;Verbal cues;Handout    Comprehension  Verbalized understanding;Returned demonstration;Verbal cues required;Need further instruction       PT Short Term Goals - 10/07/19 1751      PT SHORT TERM GOAL #1   Title  Patient will be I with initial HEP to progress with PT    Baseline  HEP provided at evaluation    Time  4    Period  Weeks    Status  New    Target Date  11/04/19      PT SHORT TERM GOAL #2   Title  Patient will report </= 4/10 pain level with standing and/or walking at work to improve activity tolerance    Baseline  7/10    Time  4    Period  Weeks    Status  New    Target Date  11/04/19      PT SHORT TERM  GOAL #3   Title  Patient will exhibit right ankle DF AROM equal to opposite side to improve gait mechanics and walking    Baseline  Right: 0 deg, Left: 3 deg    Time  4    Period  Weeks    Status  New    Target Date  11/04/19        PT Long Term Goals - 10/07/19 1754      PT LONG TERM GOAL #1   Title  Patient will be I with final HEP to maintain progress from PT    Time  8    Period  Weeks    Status  New    Target Date  12/02/19      PT LONG TERM GOAL #2   Title  Patient will report </= 1/10 pain level with standing and walking while at work    Time  8    Period  Weeks    Status  New    Target Date  12/02/19      PT LONG TERM GOAL #3   Title  Patient will report no difficulty with descending stairs to improve mobility    Time  8    Period  Weeks    Status  New    Target Date  12/02/19      PT LONG TERM GOAL #4   Title  Patient will exhibit 5/5 MMT strength of right ankle PF to improve activity tolerance and return to prior level of function    Time  8    Period  Weeks    Status  New    Target Date  12/02/19            Plan - 10/21/19 1716    Clinical Impression Statement  Patient tolerated therapy well with no adverse effects.  Progressed strengtheningof calves and initiated hip strengthening this visit with good tolerance. She did report slight increase in pain of achilles with heel raises but this resolved quickly once she completed the exercise. She would benefit from continued skilled PT to progress her strength and activity tolerance so she can stand and walk without limitation.    PT Treatment/Interventions  ADLs/Self Care Home Management;Cryotherapy;Electrical Stimulation;Iontophoresis 11m/ml Dexamethasone;Moist Heat;Ultrasound;Neuromuscular re-education;Balance training;Therapeutic exercise;Therapeutic activities;Functional mobility training;Stair training;Gait training;Patient/family education;Manual techniques;Dry needling;Passive range of motion;Taping;Joint Manipulations    PT Next Visit Plan  Assess HEP and progress PRN, manual for right calf, progress heel raises, ankle strengthening and stability, possible k-tape, initiate hip strengthening    PT Home Exercise Plan  [id number] DL heel raises on step, SL balance with EC, banded ankle DF/inv/ev with green, calf self TPR using ball, sidelying hip abduction    Consulted and Agree with Plan of Care  Patient       Patient will benefit from skilled therapeutic intervention in order to improve the following deficits and impairments:  Pain, Decreased activity tolerance, Decreased strength, Decreased balance, Abnormal gait  Visit Diagnosis: Pain in right ankle and joints of right foot  Muscle weakness (generalized)  Stiffness of right ankle, not elsewhere classified  Other abnormalities of gait and mobility     Problem List There are no problems to display for this patient.   Julie Briggs PT, DPT, LAT, ATC 10/21/19  5:42 PM Phone: [phone]: 3[facility] [address]Phone: [phone]  Fax:  [phone] Name: Julie HootsMRN:  [id number]  of Birth: Nov 08, 1985   PHYSICAL THERAPY DISCHARGE SUMMARY  Visits from Start of Care: 2  Current functional level related to goals / functional outcomes: See above   Remaining deficits: See above   Education / Equipment: HEP  Plan: Patient agrees to discharge.  Patient goals were not met. Patient is being discharged due to not returning since the last visit.  ?????    Julie Briggs, PT, DPT, LAT, ATC 01/21/20  8:53 AM Phone: [phone] Fax: [phone]

## 2019-10-22 ENCOUNTER — Encounter (HOSPITAL_COMMUNITY): Payer: Self-pay | Admitting: Emergency Medicine

## 2019-10-22 ENCOUNTER — Observation Stay (HOSPITAL_COMMUNITY)
Admission: EM | Admit: 2019-10-22 | Discharge: 2019-10-24 | Disposition: A | Discharge status: Home / Self Care | Payer: Medicaid Other | Attending: Surgery | Admitting: Surgery

## 2019-10-22 DIAGNOSIS — Z79899 Other long term (current) drug therapy: Secondary | ICD-10-CM | POA: Insufficient documentation

## 2019-10-22 DIAGNOSIS — F17200 Nicotine dependence, unspecified, uncomplicated: Secondary | ICD-10-CM | POA: Diagnosis not present

## 2019-10-22 DIAGNOSIS — Z888 Allergy status to other drugs, medicaments and biological substances status: Secondary | ICD-10-CM | POA: Diagnosis not present

## 2019-10-22 DIAGNOSIS — Z791 Long term (current) use of non-steroidal anti-inflammatories (NSAID): Secondary | ICD-10-CM | POA: Diagnosis not present

## 2019-10-22 DIAGNOSIS — Z20822 Contact with and (suspected) exposure to covid-19: Secondary | ICD-10-CM | POA: Insufficient documentation

## 2019-10-22 DIAGNOSIS — K8020 Calculus of gallbladder without cholecystitis without obstruction: Secondary | ICD-10-CM

## 2019-10-22 DIAGNOSIS — K8000 Calculus of gallbladder with acute cholecystitis without obstruction: Secondary | ICD-10-CM | POA: Diagnosis not present

## 2019-10-22 DIAGNOSIS — K810 Acute cholecystitis: Secondary | ICD-10-CM | POA: Diagnosis present

## 2019-10-22 DIAGNOSIS — R109 Unspecified abdominal pain: Secondary | ICD-10-CM | POA: Diagnosis present

## 2019-10-22 MED ORDER — SODIUM CHLORIDE 0.9% FLUSH
3.0000 mL | Freq: Once | INTRAVENOUS | Status: AC
  Administered 2019-10-23: 3 mL via INTRAVENOUS

## 2019-10-22 NOTE — ED Triage Notes (Signed)
Patient here from home reporting right upper quadrant abd pain radiating to back with n/v. . Reports hx of gall stones.

## 2019-10-23 ENCOUNTER — Encounter (HOSPITAL_COMMUNITY)
Admission: EM | Disposition: A | Discharge status: Home / Self Care | Payer: Self-pay | Source: Home / Self Care | Attending: Emergency Medicine

## 2019-10-23 ENCOUNTER — Emergency Department (HOSPITAL_COMMUNITY): Payer: Medicaid Other | Admitting: Certified Registered"

## 2019-10-23 ENCOUNTER — Emergency Department (HOSPITAL_COMMUNITY): Payer: Medicaid Other

## 2019-10-23 DIAGNOSIS — Z20822 Contact with and (suspected) exposure to covid-19: Secondary | ICD-10-CM | POA: Diagnosis not present

## 2019-10-23 DIAGNOSIS — K8000 Calculus of gallbladder with acute cholecystitis without obstruction: Secondary | ICD-10-CM | POA: Diagnosis not present

## 2019-10-23 DIAGNOSIS — K810 Acute cholecystitis: Secondary | ICD-10-CM | POA: Diagnosis present

## 2019-10-23 DIAGNOSIS — F17200 Nicotine dependence, unspecified, uncomplicated: Secondary | ICD-10-CM | POA: Diagnosis not present

## 2019-10-23 DIAGNOSIS — Z888 Allergy status to other drugs, medicaments and biological substances status: Secondary | ICD-10-CM | POA: Diagnosis not present

## 2019-10-23 HISTORY — PX: CHOLECYSTECTOMY: SHX55

## 2019-10-23 LAB — URINALYSIS, ROUTINE W REFLEX MICROSCOPIC
Bilirubin Urine: NEGATIVE
Glucose, UA: NEGATIVE mg/dL
Ketones, ur: NEGATIVE mg/dL
Nitrite: NEGATIVE
Protein, ur: 100 mg/dL — AB
Specific Gravity, Urine: 1.025 (ref 1.005–1.030)
pH: 7 (ref 5.0–8.0)

## 2019-10-23 LAB — COMPREHENSIVE METABOLIC PANEL
ALT: 14 U/L (ref 0–44)
AST: 16 U/L (ref 15–41)
Albumin: 4.5 g/dL (ref 3.5–5.0)
Alkaline Phosphatase: 44 U/L (ref 38–126)
Anion gap: 9 (ref 5–15)
BUN: 13 mg/dL (ref 6–20)
CO2: 27 mmol/L (ref 22–32)
Calcium: 8.7 mg/dL — ABNORMAL LOW (ref 8.9–10.3)
Chloride: 103 mmol/L (ref 98–111)
Creatinine, Ser: 0.61 mg/dL (ref 0.44–1.00)
GFR calc Af Amer: >60 mL/min (ref >60)
GFR calc non Af Amer: >60 mL/min (ref >60)
Glucose, Bld: 109 mg/dL — ABNORMAL HIGH (ref 70–99)
Potassium: 3.9 mmol/L (ref 3.5–5.1)
Sodium: 139 mmol/L (ref 135–145)
Total Bilirubin: 0.3 mg/dL (ref 0.3–1.2)
Total Protein: 7.6 g/dL (ref 6.5–8.1)

## 2019-10-23 LAB — CBC
HCT: 41.8 % (ref 36.0–46.0)
Hemoglobin: 13.7 g/dL (ref 12.0–15.0)
MCH: 28.5 pg (ref 26.0–34.0)
MCHC: 32.8 g/dL (ref 30.0–36.0)
MCV: 86.9 fL (ref 80.0–100.0)
Platelets: 259 10*3/uL (ref 150–400)
RBC: 4.81 MIL/uL (ref 3.87–5.11)
RDW: 12.3 % (ref 11.5–15.5)
WBC: 12.3 10*3/uL — ABNORMAL HIGH (ref 4.0–10.5)
nRBC: 0 % (ref 0.0–0.2)

## 2019-10-23 LAB — LIPASE, BLOOD: Lipase: 23 U/L (ref 11–51)

## 2019-10-23 LAB — I-STAT BETA HCG BLOOD, ED (MC, WL, AP ONLY): I-stat hCG, quantitative: <5 m[IU]/mL (ref <5)

## 2019-10-23 LAB — SARS CORONAVIRUS 2 BY RT PCR (HOSPITAL ORDER, PERFORMED IN ~~LOC~~ HOSPITAL LAB): SARS Coronavirus 2: NEGATIVE

## 2019-10-23 SURGERY — LAPAROSCOPIC CHOLECYSTECTOMY
Anesthesia: General

## 2019-10-23 MED ORDER — BUPIVACAINE-EPINEPHRINE 0.5% -1:200000 IJ SOLN
INTRAMUSCULAR | Status: DC | PRN
  Administered 2019-10-23: 30 mL

## 2019-10-23 MED ORDER — SUCCINYLCHOLINE CHLORIDE 200 MG/10ML IV SOSY
PREFILLED_SYRINGE | INTRAVENOUS | Status: DC | PRN
  Administered 2019-10-23: 120 mg via INTRAVENOUS

## 2019-10-23 MED ORDER — ESMOLOL HCL 100 MG/10ML IV SOLN
INTRAVENOUS | Status: DC | PRN
  Administered 2019-10-23: 30 mg via INTRAVENOUS
  Administered 2019-10-23: 10 mg via INTRAVENOUS
  Administered 2019-10-23: 30 mg via INTRAVENOUS

## 2019-10-23 MED ORDER — KCL IN DEXTROSE-NACL 20-5-0.45 MEQ/L-%-% IV SOLN
INTRAVENOUS | Status: DC
  Filled 2019-10-23: qty 1000

## 2019-10-23 MED ORDER — MIDAZOLAM HCL 2 MG/2ML IJ SOLN
INTRAMUSCULAR | Status: AC
  Filled 2019-10-23: qty 2

## 2019-10-23 MED ORDER — ONDANSETRON HCL 4 MG/2ML IJ SOLN
INTRAMUSCULAR | Status: AC
  Filled 2019-10-23: qty 2

## 2019-10-23 MED ORDER — FENTANYL CITRATE (PF) 100 MCG/2ML IJ SOLN
INTRAMUSCULAR | Status: AC
  Filled 2019-10-23: qty 2

## 2019-10-23 MED ORDER — SODIUM CHLORIDE 0.9 % IV BOLUS
1000.0000 mL | Freq: Once | INTRAVENOUS | Status: AC
  Administered 2019-10-23: 1000 mL via INTRAVENOUS

## 2019-10-23 MED ORDER — ENOXAPARIN SODIUM 40 MG/0.4ML ~~LOC~~ SOLN
40.0000 mg | SUBCUTANEOUS | Status: DC
  Administered 2019-10-24: 40 mg via SUBCUTANEOUS
  Filled 2019-10-23: qty 0.4

## 2019-10-23 MED ORDER — ONDANSETRON HCL 4 MG/2ML IJ SOLN
4.0000 mg | Freq: Once | INTRAMUSCULAR | Status: AC
  Administered 2019-10-23: 4 mg via INTRAVENOUS
  Filled 2019-10-23: qty 2

## 2019-10-23 MED ORDER — HYDROMORPHONE HCL 1 MG/ML IJ SOLN
1.0000 mg | Freq: Once | INTRAMUSCULAR | Status: AC
  Administered 2019-10-23: 1 mg via INTRAVENOUS
  Filled 2019-10-23: qty 1

## 2019-10-23 MED ORDER — LACTATED RINGERS IV SOLN: INTRAVENOUS | Status: DC

## 2019-10-23 MED ORDER — GABAPENTIN 300 MG PO CAPS
300.0000 mg | ORAL_CAPSULE | Freq: Two times a day (BID) | ORAL | Status: DC
  Administered 2019-10-23 – 2019-10-24 (×3): 300 mg via ORAL
  Filled 2019-10-23 (×3): qty 1

## 2019-10-23 MED ORDER — FENTANYL CITRATE (PF) 100 MCG/2ML IJ SOLN
50.0000 ug | Freq: Once | INTRAMUSCULAR | Status: AC
  Administered 2019-10-23: 50 ug via INTRAVENOUS
  Filled 2019-10-23: qty 2

## 2019-10-23 MED ORDER — FENTANYL CITRATE (PF) 100 MCG/2ML IJ SOLN: 50.0000 ug | Freq: Once | INTRAMUSCULAR | Status: DC

## 2019-10-23 MED ORDER — SIMETHICONE 80 MG PO CHEW: 40.0000 mg | CHEWABLE_TABLET | Freq: Four times a day (QID) | ORAL | Status: DC | PRN

## 2019-10-23 MED ORDER — CHLORHEXIDINE GLUCONATE 0.12 % MT SOLN
15.0000 mL | Freq: Once | OROMUCOSAL | Status: AC
  Administered 2019-10-23: 15 mL via OROMUCOSAL

## 2019-10-23 MED ORDER — DIPHENHYDRAMINE HCL 50 MG/ML IJ SOLN: 12.5000 mg | Freq: Four times a day (QID) | INTRAMUSCULAR | Status: DC | PRN

## 2019-10-23 MED ORDER — SCOPOLAMINE 1 MG/3DAYS TD PT72
1.0000 | MEDICATED_PATCH | TRANSDERMAL | Status: DC
  Administered 2019-10-23: 1.5 mg via TRANSDERMAL
  Filled 2019-10-23: qty 1

## 2019-10-23 MED ORDER — KETOROLAC TROMETHAMINE 30 MG/ML IJ SOLN
INTRAMUSCULAR | Status: DC | PRN
Start: 2019-10-23 — End: 2019-10-23
  Administered 2019-10-23: 30 mg via INTRAVENOUS

## 2019-10-23 MED ORDER — SODIUM CHLORIDE 0.9 % IV SOLN
INTRAVENOUS | Status: AC
  Filled 2019-10-23: qty 20

## 2019-10-23 MED ORDER — ACETAMINOPHEN 500 MG PO TABS
1000.0000 mg | ORAL_TABLET | ORAL | Status: AC
  Administered 2019-10-23: 1000 mg via ORAL
  Filled 2019-10-23: qty 2

## 2019-10-23 MED ORDER — ROCURONIUM BROMIDE 10 MG/ML (PF) SYRINGE
PREFILLED_SYRINGE | INTRAVENOUS | Status: AC
  Filled 2019-10-23: qty 10

## 2019-10-23 MED ORDER — DIPHENHYDRAMINE HCL 12.5 MG/5ML PO ELIX: 12.5000 mg | ORAL_SOLUTION | Freq: Four times a day (QID) | ORAL | Status: DC | PRN

## 2019-10-23 MED ORDER — METHOCARBAMOL 500 MG PO TABS
500.0000 mg | ORAL_TABLET | Freq: Four times a day (QID) | ORAL | Status: DC | PRN
  Administered 2019-10-23 – 2019-10-24 (×3): 500 mg via ORAL
  Filled 2019-10-23 (×3): qty 1

## 2019-10-23 MED ORDER — MIDAZOLAM HCL 2 MG/2ML IJ SOLN
INTRAMUSCULAR | Status: DC | PRN
  Administered 2019-10-23: 2 mg via INTRAVENOUS

## 2019-10-23 MED ORDER — PROPOFOL 10 MG/ML IV BOLUS
INTRAVENOUS | Status: DC | PRN
  Administered 2019-10-23: 150 mg via INTRAVENOUS

## 2019-10-23 MED ORDER — PROPOFOL 10 MG/ML IV BOLUS
INTRAVENOUS | Status: AC
  Filled 2019-10-23: qty 20

## 2019-10-23 MED ORDER — DEXAMETHASONE SODIUM PHOSPHATE 10 MG/ML IJ SOLN
INTRAMUSCULAR | Status: DC | PRN
  Administered 2019-10-23: 8 mg via INTRAVENOUS

## 2019-10-23 MED ORDER — ONDANSETRON HCL 4 MG/2ML IJ SOLN
INTRAMUSCULAR | Status: DC | PRN
  Administered 2019-10-23: 4 mg via INTRAVENOUS

## 2019-10-23 MED ORDER — DEXAMETHASONE SODIUM PHOSPHATE 10 MG/ML IJ SOLN
INTRAMUSCULAR | Status: AC
  Filled 2019-10-23: qty 1

## 2019-10-23 MED ORDER — ONDANSETRON HCL 4 MG/2ML IJ SOLN: 4.0000 mg | Freq: Four times a day (QID) | INTRAMUSCULAR | Status: DC | PRN

## 2019-10-23 MED ORDER — SUGAMMADEX SODIUM 200 MG/2ML IV SOLN
INTRAVENOUS | Status: DC | PRN
  Administered 2019-10-23: 200 mg via INTRAVENOUS

## 2019-10-23 MED ORDER — ROCURONIUM BROMIDE 10 MG/ML (PF) SYRINGE
PREFILLED_SYRINGE | INTRAVENOUS | Status: DC | PRN
  Administered 2019-10-23: 40 mg via INTRAVENOUS

## 2019-10-23 MED ORDER — DEXTROSE 5 % IV SOLN
INTRAVENOUS | Status: DC | PRN
  Administered 2019-10-23: 2 g via INTRAVENOUS

## 2019-10-23 MED ORDER — ACETAMINOPHEN 500 MG PO TABS
1000.0000 mg | ORAL_TABLET | Freq: Three times a day (TID) | ORAL | Status: DC
  Administered 2019-10-23 – 2019-10-24 (×4): 1000 mg via ORAL
  Filled 2019-10-23 (×4): qty 2

## 2019-10-23 MED ORDER — PANTOPRAZOLE SODIUM 40 MG IV SOLR
40.0000 mg | Freq: Every day | INTRAVENOUS | Status: DC
  Administered 2019-10-23: 40 mg via INTRAVENOUS
  Filled 2019-10-23: qty 40

## 2019-10-23 MED ORDER — LIDOCAINE 2% (20 MG/ML) 5 ML SYRINGE
INTRAMUSCULAR | Status: AC
  Filled 2019-10-23: qty 5

## 2019-10-23 MED ORDER — FENTANYL CITRATE (PF) 100 MCG/2ML IJ SOLN
25.0000 ug | INTRAMUSCULAR | Status: DC | PRN
  Administered 2019-10-23 (×2): 50 ug via INTRAVENOUS

## 2019-10-23 MED ORDER — FENTANYL CITRATE (PF) 250 MCG/5ML IJ SOLN
INTRAMUSCULAR | Status: DC | PRN
  Administered 2019-10-23: 50 ug via INTRAVENOUS
  Administered 2019-10-23: 25 ug via INTRAVENOUS
  Administered 2019-10-23 (×4): 50 ug via INTRAVENOUS
  Administered 2019-10-23: 25 ug via INTRAVENOUS

## 2019-10-23 MED ORDER — OXYCODONE HCL 5 MG/5ML PO SOLN: 5.0000 mg | Freq: Once | ORAL | Status: DC | PRN

## 2019-10-23 MED ORDER — ONDANSETRON 4 MG PO TBDP: 4.0000 mg | ORAL_TABLET | Freq: Four times a day (QID) | ORAL | Status: DC | PRN

## 2019-10-23 MED ORDER — BUPIVACAINE-EPINEPHRINE (PF) 0.5% -1:200000 IJ SOLN
INTRAMUSCULAR | Status: AC
  Filled 2019-10-23: qty 30

## 2019-10-23 MED ORDER — GABAPENTIN 300 MG PO CAPS
300.0000 mg | ORAL_CAPSULE | ORAL | Status: AC
  Administered 2019-10-23: 300 mg via ORAL
  Filled 2019-10-23: qty 1

## 2019-10-23 MED ORDER — KETOROLAC TROMETHAMINE 30 MG/ML IJ SOLN: 30.0000 mg | Freq: Once | INTRAMUSCULAR | Status: DC | PRN

## 2019-10-23 MED ORDER — SUCCINYLCHOLINE CHLORIDE 200 MG/10ML IV SOSY
PREFILLED_SYRINGE | INTRAVENOUS | Status: AC
  Filled 2019-10-23: qty 10

## 2019-10-23 MED ORDER — LIDOCAINE 2% (20 MG/ML) 5 ML SYRINGE
INTRAMUSCULAR | Status: DC | PRN
  Administered 2019-10-23: 60 mg via INTRAVENOUS

## 2019-10-23 MED ORDER — OXYCODONE HCL 5 MG PO TABS
5.0000 mg | ORAL_TABLET | ORAL | Status: DC | PRN
  Administered 2019-10-23 (×2): 5 mg via ORAL
  Administered 2019-10-24: 10 mg via ORAL
  Administered 2019-10-24: 5 mg via ORAL
  Filled 2019-10-23: qty 1
  Filled 2019-10-23: qty 2
  Filled 2019-10-23: qty 1

## 2019-10-23 MED ORDER — OXYCODONE HCL 5 MG PO TABS: 5.0000 mg | ORAL_TABLET | Freq: Once | ORAL | Status: DC | PRN

## 2019-10-23 MED ORDER — LACTATED RINGERS IV SOLN
INTRAVENOUS | Status: AC | PRN
  Administered 2019-10-23: 1000 mL via INTRAVENOUS

## 2019-10-23 MED ORDER — CEFAZOLIN SODIUM-DEXTROSE 2-4 GM/100ML-% IV SOLN
INTRAVENOUS | Status: AC
  Filled 2019-10-23: qty 100

## 2019-10-23 MED ORDER — MORPHINE SULFATE (PF) 2 MG/ML IV SOLN: 1.0000 mg | INTRAVENOUS | Status: DC | PRN

## 2019-10-23 SURGICAL SUPPLY — 53 items
APPLICATOR ARISTA FLEXITIP XL (MISCELLANEOUS) IMPLANT
APPLIER CLIP 5 13 M/L LIGAMAX5 (MISCELLANEOUS)
APPLIER CLIP ROT 10 11.4 M/L (STAPLE)
BENZOIN TINCTURE PRP APPL 2/3 (GAUZE/BANDAGES/DRESSINGS) IMPLANT
BNDG ADH 1X3 SHEER STRL LF (GAUZE/BANDAGES/DRESSINGS) ×12 IMPLANT
CABLE HIGH FREQUENCY MONO STRZ (ELECTRODE) ×3 IMPLANT
CHLORAPREP W/TINT 26 (MISCELLANEOUS) ×3 IMPLANT
CLIP APPLIE 5 13 M/L LIGAMAX5 (MISCELLANEOUS) IMPLANT
CLIP APPLIE ROT 10 11.4 M/L (STAPLE) IMPLANT
CLIP VESOLOCK MED LG 6/CT (CLIP) IMPLANT
CLOSURE STERI-STRIP 1/2X4 (GAUZE/BANDAGES/DRESSINGS) ×1
CLOSURE WOUND 1/2 X4 (GAUZE/BANDAGES/DRESSINGS)
CLSR STERI-STRIP ANTIMIC 1/2X4 (GAUZE/BANDAGES/DRESSINGS) ×2 IMPLANT
COVER MAYO STAND STRL (DRAPES) IMPLANT
COVER SURGICAL LIGHT HANDLE (MISCELLANEOUS) ×3 IMPLANT
COVER WAND RF STERILE (DRAPES) IMPLANT
DECANTER SPIKE VIAL GLASS SM (MISCELLANEOUS) ×3 IMPLANT
DERMABOND ADVANCED (GAUZE/BANDAGES/DRESSINGS) ×2
DERMABOND ADVANCED .7 DNX12 (GAUZE/BANDAGES/DRESSINGS) ×1 IMPLANT
DRAPE C-ARM 42X120 X-RAY (DRAPES) IMPLANT
DRSG TEGADERM 2-3/8X2-3/4 SM (GAUZE/BANDAGES/DRESSINGS) IMPLANT
DRSG TEGADERM 6X8 (GAUZE/BANDAGES/DRESSINGS) ×3 IMPLANT
ELECT REM PT RETURN 15FT ADLT (MISCELLANEOUS) ×3 IMPLANT
GAUZE SPONGE 2X2 8PLY STRL LF (GAUZE/BANDAGES/DRESSINGS) ×1 IMPLANT
GLOVE BIO SURGEON STRL SZ7.5 (GLOVE) ×3 IMPLANT
GLOVE INDICATOR 8.0 STRL GRN (GLOVE) ×3 IMPLANT
GOWN STRL REUS W/TWL XL LVL3 (GOWN DISPOSABLE) ×9 IMPLANT
GRASPER SUT TROCAR 14GX15 (MISCELLANEOUS) IMPLANT
HEMOSTAT ARISTA ABSORB 3G PWDR (HEMOSTASIS) IMPLANT
HEMOSTAT SNOW SURGICEL 2X4 (HEMOSTASIS) IMPLANT
KIT BASIN (CUSTOM PROCEDURE TRAY) ×3 IMPLANT
KIT TURNOVER KIT A (KITS) IMPLANT
L-HOOK LAP DISP 36CM (ELECTROSURGICAL)
LHOOK LAP DISP 36CM (ELECTROSURGICAL) IMPLANT
POUCH RETRIEVAL ECOSAC 10 (ENDOMECHANICALS) ×1 IMPLANT
POUCH RETRIEVAL ECOSAC 10MM (ENDOMECHANICALS) ×2
SCISSORS LAP 5X35 DISP (ENDOMECHANICALS) ×3 IMPLANT
SET CHOLANGIOGRAPH MIX (MISCELLANEOUS) IMPLANT
SET IRRIG TUBING LAPAROSCOPIC (IRRIGATION / IRRIGATOR) ×3 IMPLANT
SET TUBE SMOKE EVAC HIGH FLOW (TUBING) ×3 IMPLANT
SLEEVE XCEL OPT CAN 5 100 (ENDOMECHANICALS) ×6 IMPLANT
SPONGE GAUZE 2X2 STER 10/PKG (GAUZE/BANDAGES/DRESSINGS) ×2
STRIP CLOSURE SKIN 1/2X4 (GAUZE/BANDAGES/DRESSINGS) IMPLANT
SUT MNCRL AB 4-0 PS2 18 (SUTURE) ×3 IMPLANT
SUT VIC AB 0 UR5 27 (SUTURE) IMPLANT
SUT VICRYL 0 TIES 12 18 (SUTURE) IMPLANT
SUT VICRYL 0 UR6 27IN ABS (SUTURE) IMPLANT
TOWEL OR 17X26 10 PK STRL BLUE (TOWEL DISPOSABLE) ×3 IMPLANT
TOWEL OR NON WOVEN STRL DISP B (DISPOSABLE) ×3 IMPLANT
TRAY LAPAROSCOPIC (CUSTOM PROCEDURE TRAY) ×3 IMPLANT
TROCAR BLADELESS OPT 5 100 (ENDOMECHANICALS) ×3 IMPLANT
TROCAR XCEL BLUNT TIP 100MML (ENDOMECHANICALS) IMPLANT
TROCAR XCEL NON-BLD 11X100MML (ENDOMECHANICALS) IMPLANT

## 2019-10-23 NOTE — Anesthesia Procedure Notes (Addendum)
Procedure Name: Intubation Date/Time: 10/23/2019 10:02 AM Performed by: Eben Burow, CRNA Pre-anesthesia Checklist: Patient identified, Emergency Drugs available, Suction available, Patient being monitored and Timeout performed Patient Re-evaluated:Patient Re-evaluated prior to induction Oxygen Delivery Method: Circle system utilized Preoxygenation: Pre-oxygenation with 100% oxygen Induction Type: IV induction and Rapid sequence Laryngoscope Size: Mac and 4 Grade View: Grade I Tube type: Oral Tube size: 7.0 mm Number of attempts: 1 Airway Equipment and Method: Stylet Placement Confirmation: ETT inserted through vocal cords under direct vision,  positive ETCO2 and breath sounds checked- equal and bilateral Secured at: 21 cm Tube secured with: Tape Dental Injury: Teeth and Oropharynx as per pre-operative assessment

## 2019-10-23 NOTE — Transfer of Care (Signed)
Immediate Anesthesia Transfer of Care Note  Patient: Julie Briggs  Procedure(s) Performed: LAPAROSCOPIC CHOLECYSTECTOMY (N/A )  Patient Location: PACU  Anesthesia Type:General  Level of Consciousness: awake, alert  and oriented  Airway & Oxygen Therapy: Patient Spontanous Breathing and Patient connected to face mask oxygen  Post-op Assessment: Report given to RN and Post -op Vital signs reviewed and stable  Post vital signs: Reviewed and stable  Last Vitals:  Vitals Value Taken Time  BP 142/90 10/23/19 1127  Temp    Pulse 106 10/23/19 1127  Resp 19 10/23/19 1127  SpO2 100 % 10/23/19 1127  Vitals shown include unvalidated device data.  Last Pain:  Vitals:   10/23/19 0917  TempSrc: Oral  PainSc:          Complications: No complications documented.

## 2019-10-23 NOTE — ED Notes (Signed)
Pt brushed teeth. CHG wiped down.

## 2019-10-23 NOTE — Discharge Instructions (Signed)
[facility] LAPAROSCOPIC SURGERY: POST OP INSTRUCTIONS Always review your discharge instruction sheet given to you by the facility where your surgery was performed. IF YOU HAVE DISABILITY OR FAMILY LEAVE FORMS, YOU MUST BRING THEM TO THE OFFICE FOR PROCESSING.   DO NOT GIVE THEM TO YOUR DOCTOR.  PAIN CONTROL  1. First take acetaminophen (Tylenol) AND/or ibuprofen (Advil) to control your pain after surgery.  Follow directions on package.  Taking acetaminophen (Tylenol) and/or ibuprofen (Advil) regularly after surgery will help to control your pain and lower the amount of prescription pain medication you may need.  You should not take more than 3,000 mg (3 grams) of acetaminophen (Tylenol) in 24 hours.  You should not take ibuprofen (Advil), aleve, motrin, naprosyn or other NSAIDS if you have a history of stomach ulcers or chronic kidney disease.  2. A prescription for pain medication may be given to you upon discharge.  Take your pain medication as prescribed, if you still have uncontrolled pain after taking acetaminophen (Tylenol) or ibuprofen (Advil). 3. Use ice packs to help control pain. 4. If you need a refill on your pain medication, please contact your pharmacy.  They will contact our office to request authorization. Prescriptions will not be filled after 5pm or on week-ends.  HOME MEDICATIONS 5. Take your usually prescribed medications unless otherwise directed.  DIET 6. You should follow a light diet the first few days after arrival home.  Be sure to include lots of fluids daily. Avoid fatty, fried foods.   CONSTIPATION 7. It is common to experience some constipation after surgery and if you are taking pain medication.  Increasing fluid intake and taking a stool softener (such as Colace) will usually help or prevent this problem from occurring.  A mild laxative (Milk of Magnesia or Miralax) should be taken according to package instructions if there are no bowel  movements after 48 hours.  WOUND/INCISION CARE 8. Most patients will experience some swelling and bruising in the area of the incisions.  Ice packs will help.  Swelling and bruising can take several days to resolve.  9. Unless discharge instructions indicate otherwise, follow guidelines below  a. STERI-STRIPS - you may remove your outer bandages 48 hours after surgery, and you may shower at that time.  You have steri-strips (small skin tapes) in place directly over the incision.  These strips should be left on the skin for 7-10 days.   b. DERMABOND/SKIN GLUE - you may shower in 24 hours.  The glue will flake off over the next 2-3 weeks. 10. Any sutures or staples will be removed at the office during your follow-up visit.  ACTIVITIES 11. You may resume regular (light) daily activities beginning the next day--such as daily self-care, walking, climbing stairs--gradually increasing activities as tolerated.  You may have sexual intercourse when it is comfortable.  Refrain from any heavy lifting or straining until approved by your doctor. a. You may drive when you are no longer taking prescription pain medication, you can comfortably wear a seatbelt, and you can safely maneuver your car and apply brakes.  FOLLOW-UP 12. You should see your doctor in the office for a follow-up appointment approximately 2-3 weeks after your surgery.  You should have been given your post-op/follow-up appointment when your surgery was scheduled.  If you did not receive a post-op/follow-up appointment, make sure that you call for this appointment within a day or two after you arrive home to insure a convenient appointment time.  WHEN   TO CALL YOUR DOCTOR: 1. Fever over 101.0 2. Inability to urinate 3. Continued bleeding from incision. 4. Increased pain, redness, or drainage from the incision. 5. Increasing abdominal pain  The clinic staff is available to answer your questions during regular business hours.  Please don't  hesitate to call and ask to speak to one of the nurses for clinical concerns.  If you have a medical emergency, go to the nearest emergency room or call 911.  A surgeon from [facility] is always on call at the hospital. [address], [address], [place], [place]  [address] ? [address] [address], [place], [place]   [address] [phone] ? [phone] ? FAX [phone] Web site: [email or web address]  

## 2019-10-23 NOTE — ED Notes (Signed)
Pt transferred to short stay with belongings.

## 2019-10-23 NOTE — Op Note (Signed)
Julie Briggs [id number] Apr 04, 1986 10/23/2019  Laparoscopic Cholecystectomy Procedure Note  Indications: This patient presents with symptomatic gallbladder disease and will undergo laparoscopic cholecystectomy.  Pre-operative Diagnosis: Calculus of gallbladder with acute cholecystitis, without mention of obstruction  Post-operative Diagnosis: Same  Surgeon: Gaynelle Adu MD FACS  Assistants: none  Anesthesia: General endotracheal anesthesia  Procedure Details  The patient was seen again in the Holding Room. The risks, benefits, complications, treatment options, and expected outcomes were discussed with the patient. The possibilities of reaction to medication, pulmonary aspiration, perforation of viscus, bleeding, recurrent infection, finding a normal gallbladder, the need for additional procedures, failure to diagnose a condition, the possible need to convert to an open procedure, and creating a complication requiring transfusion or operation were discussed with the patient. The likelihood of improving the patient's symptoms with return to their baseline status is good.  The patient and/or family concurred with the proposed plan, giving informed consent. The site of surgery properly noted. The patient was taken to Operating Room, identified as [facility] and the procedure verified as Laparoscopic Cholecystectomy. A Time Out was held and the above information confirmed. Antibiotic prophylaxis was administered.  She received oral Tylenol and gabapentin preoperatively  Prior to the induction of general anesthesia, antibiotic prophylaxis was administered. General endotracheal anesthesia was then administered and tolerated well. After the induction, the abdomen was prepped with Chloraprep and draped in the sterile fashion. The patient was positioned in the supine position.  Local anesthetic agent was injected into the skin near the umbilicus and an incision made. We  dissected down to the abdominal fascia with blunt dissection.  The fascia was incised vertically and we entered the peritoneal cavity bluntly.  A pursestring suture of 0-Vicryl was placed around the fascial opening.  The Hasson cannula was inserted and secured with the stay suture.  Pneumoperitoneum was then created with CO2 and tolerated well without any adverse changes in the patient's vital signs. An 5-mm port was placed in the subxiphoid position.  Two 5-mm ports were placed in the right upper quadrant. All skin incisions were infiltrated with a local anesthetic agent before making the incision and placing the trocars.   We positioned the patient in reverse Trendelenburg, tilted slightly to the patient's left.  The gallbladder was identified.  The gallbladder was edematous and very distended. in Order to facilitate retraction I aspirated the gallbladder. the fundus was grasped and retracted cephalad. Adhesions were lysed bluntly and with the electrocautery where indicated, taking care not to injure any adjacent organs or viscus. The infundibulum was grasped and retracted laterally, exposing the peritoneum overlying the triangle of Calot. This was then divided and exposed in a blunt fashion.  The duodenum was loosely attached to the infundibulum but I was able to gently bluntly dissected out of the way.  A critical view of the cystic duct and cystic artery was obtained.  The cystic duct was clearly identified and bluntly dissected circumferentially.   The cystic duct was then ligated with clips and divided. The cystic artery which had been identified & dissected free was ligated with clips and divided as well.   The gallbladder was dissected from the liver bed in retrograde fashion with the electrocautery. The gallbladder was removed and placed in an Ecco sac.  The gallbladder and Ecco sac were then removed through the umbilical port site. The liver bed was irrigated and inspected. Hemostasis was achieved  with the electrocautery. Copious irrigation was utilized and was repeatedly aspirated until  clear.  The pursestring suture was used to close the umbilical fascia.    We again inspected the right upper quadrant for hemostasis.  The umbilical closure was inspected and there was no air leak and nothing trapped within the closure. Pneumoperitoneum was released as we removed the trocars.  4-0 Monocryl was used to close the skin.    steri-strips, and clean dressings were applied. The patient was then extubated and brought to the recovery room in stable condition. Instrument, sponge, and needle counts were correct at closure and at the conclusion of the case.   Findings: Cholecystitis with Cholelithiasis  Estimated Blood Loss: Minimal         Drains: none         Specimens: Gallbladder           Complications: None; patient tolerated the procedure well.         Disposition: PACU - hemodynamically stable.         Condition: stable  Leighton Ruff. Redmond Pulling, MD, FACS General, Bariatric, & Minimally Invasive Surgery [facility], [place]

## 2019-10-23 NOTE — Anesthesia Postprocedure Evaluation (Signed)
Anesthesia Post Note  Patient: Julie Briggs  Procedure(s) Performed: LAPAROSCOPIC CHOLECYSTECTOMY (N/A )     Patient location during evaluation: PACU Anesthesia Type: General Level of consciousness: awake and alert Pain management: pain level controlled Vital Signs Assessment: post-procedure vital signs reviewed and stable Respiratory status: spontaneous breathing, nonlabored ventilation, respiratory function stable and patient connected to nasal cannula oxygen Cardiovascular status: blood pressure returned to baseline and stable Postop Assessment: no apparent nausea or vomiting Anesthetic complications: no   No complications documented.  Last Vitals:  Vitals:   10/23/19 1129 10/23/19 1130  BP:  136/81  Pulse: 100 (!) 101  Resp: 19 (!) 22  Temp:    SpO2: 99% 100%    Last Pain:  Vitals:   10/23/19 1127  TempSrc:   PainSc: 0-No pain                 Handy Mcloud S

## 2019-10-23 NOTE — ED Notes (Signed)
S/B  Surgery.

## 2019-10-23 NOTE — Anesthesia Preprocedure Evaluation (Addendum)
Anesthesia Evaluation  Patient identified by MRN, date of birth, ID band Patient awake    Reviewed: Allergy & Precautions, NPO status , Patient's Chart, lab work & pertinent test results  Airway Mallampati: II  TM Distance: >3 FB Neck ROM: Full    Dental  (+) Missing, Dental Advisory Given, Chipped   Pulmonary Current Smoker,    Pulmonary exam normal breath sounds clear to auscultation       Cardiovascular negative cardio ROS Normal cardiovascular exam Rhythm:Regular Rate:Normal     Neuro/Psych negative neurological ROS  negative psych ROS   GI/Hepatic negative GI ROS, Neg liver ROS,   Endo/Other  negative endocrine ROS  Renal/GU negative Renal ROS  negative genitourinary   Musculoskeletal negative musculoskeletal ROS (+)   Abdominal   Peds negative pediatric ROS (+)  Hematology negative hematology ROS (+)   Anesthesia Other Findings   Reproductive/Obstetrics negative OB ROS                            Anesthesia Physical Anesthesia Plan  ASA: II  Anesthesia Plan: General   Post-op Pain Management:    Induction: Intravenous  PONV Risk Score and Plan: 3 and Ondansetron, Dexamethasone, Treatment may vary due to age or medical condition and Midazolam  Airway Management Planned: Oral ETT  Additional Equipment:   Intra-op Plan:   Post-operative Plan: Extubation in OR  Informed Consent: I have reviewed the patients History and Physical, chart, labs and discussed the procedure including the risks, benefits and alternatives for the proposed anesthesia with the patient or authorized representative who has indicated his/her understanding and acceptance.     Dental advisory given  Plan Discussed with: CRNA and Surgeon  Anesthesia Plan Comments:         Anesthesia Quick Evaluation

## 2019-10-23 NOTE — H&P (Signed)
CC: abd pain, nausea  Requesting provider: Dr Judd Lienelo  HPI: Julie Briggs is an 34 y.o. female who is here for right upper quadrant pain.  Patient has been diagnosed with symptomatic cholelithiasis for the past couple months.  She is in process for referral to general surgery.  She presented to the ED last night with worsening right upper quadrant pain after eating a bag of chips.  She describes the pain as midepigastric and radiating to the right side into her back.  It is not eased despite the use of narcotics.  She has some associated nausea but no vomiting.  Past surgical history significant for laparoscopic tubal ligation.  No major past medical history.  Past Medical History:  Diagnosis Date  . Migraine     Past Surgical History:  Procedure Laterality Date  . TUBAL LIGATION      History reviewed. No pertinent family history.  Social:  reports that she has been smoking. She uses smokeless tobacco. She reports current alcohol use. She reports that she does not use drugs.  Allergies:  Allergies  Allergen Reactions  . Iodine Hives    Medications: I have reviewed the patient's current medications.  Results for orders placed or performed during the hospital encounter of 10/22/19 (from the past 48 hour(s))  Lipase, blood     Status: None   Collection Time: 10/23/19 12:07 AM  Result Value Ref Range   Lipase 23 11 - 51 U/L    Comment: Performed at [facility], [address] [place], [place] [address]  Comprehensive metabolic panel     Status: Abnormal   Collection Time: 10/23/19 12:07 AM  Result Value Ref Range   Sodium 139 135 - 145 mmol/L   Potassium 3.9 3.5 - 5.1 mmol/L   Chloride 103 98 - 111 mmol/L   CO2 27 22 - 32 mmol/L   Glucose, Bld 109 (H) 70 - 99 mg/dL    Comment: Glucose reference range applies only to samples taken after fasting for at least 8 hours.   BUN 13 6 - 20 mg/dL   Creatinine, Ser 6.040.61 0.44 - 1.00 mg/dL   Calcium 8.7  (L) 8.9 - 10.3 mg/dL   Total Protein 7.6 6.5 - 8.1 g/dL   Albumin 4.5 3.5 - 5.0 g/dL   AST 16 15 - 41 U/L   ALT 14 0 - 44 U/L   Alkaline Phosphatase 44 38 - 126 U/L   Total Bilirubin 0.3 0.3 - 1.2 mg/dL   GFR calc non Af Amer >60 >60 mL/min   GFR calc Af Amer >60 >60 mL/min   Anion gap 9 5 - 15    Comment: Performed at [facility], [address] [place], [place] [address]  CBC     Status: Abnormal   Collection Time: 10/23/19 12:07 AM  Result Value Ref Range   WBC 12.3 (H) 4.0 - 10.5 K/uL   RBC 4.81 3.87 - 5.11 MIL/uL   Hemoglobin 13.7 12.0 - 15.0 g/dL   HCT 11.941.8 36 - 46 %   MCV 86.9 80.0 - 100.0 fL   MCH 28.5 26.0 - 34.0 pg   MCHC 32.8 30.0 - 36.0 g/dL   RDW 14.712.3 82.911.5 - 56.215.5 %   Platelets 259 150 - 400 K/uL   nRBC 0.0 0.0 - 0.2 %    Comment: Performed at [facility], [address] [place], [place] [address]  I-Stat beta hCG blood, ED  Status: None   Collection Time: 10/23/19 12:10 AM  Result Value Ref Range   I-stat hCG, quantitative <5.0 <5 mIU/mL   Comment 3            Comment:   GEST. AGE      CONC.  (mIU/mL)   <=1 WEEK        5 - 50     2 WEEKS       50 - 500     3 WEEKS       100 - 10,000     4 WEEKS     1,000 - 30,000        FEMALE AND NON-PREGNANT FEMALE:     LESS THAN 5 mIU/mL   Urinalysis, Routine w reflex microscopic     Status: Abnormal   Collection Time: 10/23/19  1:27 AM  Result Value Ref Range   Color, Urine YELLOW YELLOW   APPearance HAZY (A) CLEAR   Specific Gravity, Urine 1.025 1.005 - 1.030   pH 7.0 5.0 - 8.0   Glucose, UA NEGATIVE NEGATIVE mg/dL   Hgb urine dipstick SMALL (A) NEGATIVE   Bilirubin Urine NEGATIVE NEGATIVE   Ketones, ur NEGATIVE NEGATIVE mg/dL   Protein, ur 671 (A) NEGATIVE mg/dL   Nitrite NEGATIVE NEGATIVE   Leukocytes,Ua TRACE (A) NEGATIVE   RBC / HPF 0-5 0 - 5 RBC/hpf   WBC, UA 0-5 0 - 5 WBC/hpf   Bacteria, UA MANY (A) NONE SEEN   Squamous Epithelial / LPF 6-10 0 - 5   Mucus  PRESENT     Comment: Performed at [facility], [address] [place], [place] [address]  SARS Coronavirus 2 by RT PCR (hospital order, performed in [facility] hospital lab) Nasopharyngeal Nasopharyngeal Swab     Status: None   Collection Time: 10/23/19  4:55 AM   Specimen: Nasopharyngeal Swab  Result Value Ref Range   SARS Coronavirus 2 NEGATIVE NEGATIVE    Comment: (NOTE) SARS-CoV-2 target nucleic acids are NOT DETECTED.  The SARS-CoV-2 RNA is generally detectable in upper and lower respiratory specimens during the acute phase of infection. The lowest concentration of SARS-CoV-2 viral copies this assay can detect is 250 copies / mL. A negative result does not preclude SARS-CoV-2 infection and should not be used as the sole basis for treatment or other patient management decisions.  A negative result may occur with improper specimen collection / handling, submission of specimen other than nasopharyngeal swab, presence of viral mutation(s) within the areas targeted by this assay, and inadequate number of viral copies (<250 copies / mL). A negative result must be combined with clinical observations, patient history, and epidemiological information.  Fact Sheet for Patients:   BoilerBrush.com.cy  Fact Sheet for Healthcare Providers: https://pope.com/  This test is not yet approved or  cleared by the [place] FDA and has been authorized for detection and/or diagnosis of SARS-CoV-2 by FDA under an Emergency Use Authorization (EUA).  This EUA will remain in effect (meaning this test can be used) for the duration of the COVID-19 declaration under Section 564(b)(1) of the Act, 21 U.S.C. section 360bbb-3(b)(1), unless the authorization is terminated or revoked sooner.  Performed at [facility], [address], [place], [place] [address]     US Abdomen Limited RUQ  Result Date:  10/23/2019 CLINICAL DATA:  Right upper quadrant abdominal pain EXAM: ULTRASOUND ABDOMEN LIMITED RIGHT UPPER QUADRANT COMPARISON:  October 19, 2019 FINDINGS: Gallbladder: There are gallstones without  evidence for gallbladder wall thickening. The sonographic Eulah Pont sign is negative. The gallbladder is distended. There is a stone wedged at the gallbladder neck. Common bile duct: Diameter: 6 mm. Liver: No focal lesion identified. Within normal limits in parenchymal echogenicity. Portal vein is patent on color Doppler imaging with normal direction of blood flow towards the liver. Other: None. IMPRESSION: There is cholelithiasis without secondary signs of acute cholecystitis. The gallbladder is distended with a gallstone wedged at the gallbladder neck. Electronically Signed   By: Katherine Mantle M.D.   On: 10/23/2019 03:07    ROS - all of the below systems have been reviewed with the patient and positives are indicated with bold text General: chills, fever or night sweats Eyes: blurry vision or double vision ENT: epistaxis or sore throat Allergy/Immunology: itchy/watery eyes or nasal congestion Hematologic/Lymphatic: bleeding problems, blood clots or swollen lymph nodes Endocrine: temperature intolerance or unexpected weight changes Breast: new or changing breast lumps or nipple discharge Resp: cough, shortness of breath, or wheezing CV: chest pain or dyspnea on exertion GI: as per HPI GU: dysuria, trouble voiding, or hematuria MSK: joint pain or joint stiffness Neuro: TIA or stroke symptoms Derm: pruritus and skin lesion changes Psych: anxiety and depression  PE Blood pressure 131/87, pulse 68, temperature 98.2 F (36.8 C), temperature source Oral, resp. rate 18, SpO2 100 %. Constitutional: NAD; conversant; no deformities Eyes: Moist conjunctiva; no lid lag; anicteric; PERRL Neck: Trachea midline; no thyromegaly Lungs: Normal respiratory effort; no tactile fremitus CV: RRR; no palpable thrills;  no pitting edema GI: Abd tender to palpation in the right upper quadrant; no palpable hepatosplenomegaly MSK: Normal range of motion of extremities; no clubbing/cyanosis Psychiatric: Appropriate affect; alert and oriented x3 Lymphatic: No palpable cervical or axillary lymphadenopathy  Results for orders placed or performed during the hospital encounter of 10/22/19 (from the past 48 hour(s))  Lipase, blood     Status: None   Collection Time: 10/23/19 12:07 AM  Result Value Ref Range   Lipase 23 11 - 51 U/L    Comment: Performed at [facility], [address], [place], [place] [address]  Comprehensive metabolic panel     Status: Abnormal   Collection Time: 10/23/19 12:07 AM  Result Value Ref Range   Sodium 139 135 - 145 mmol/L   Potassium 3.9 3.5 - 5.1 mmol/L   Chloride 103 98 - 111 mmol/L   CO2 27 22 - 32 mmol/L   Glucose, Bld 109 (H) 70 - 99 mg/dL    Comment: Glucose reference range applies only to samples taken after fasting for at least 8 hours.   BUN 13 6 - 20 mg/dL   Creatinine, Ser 5.80 0.44 - 1.00 mg/dL   Calcium 8.7 (L) 8.9 - 10.3 mg/dL   Total Protein 7.6 6.5 - 8.1 g/dL   Albumin 4.5 3.5 - 5.0 g/dL   AST 16 15 - 41 U/L   ALT 14 0 - 44 U/L   Alkaline Phosphatase 44 38 - 126 U/L   Total Bilirubin 0.3 0.3 - 1.2 mg/dL   GFR calc non Af Amer >60 >60 mL/min   GFR calc Af Amer >60 >60 mL/min   Anion gap 9 5 - 15    Comment: Performed at [facility], [address], [place], [place] [address]  CBC     Status: Abnormal   Collection Time: 10/23/19 12:07 AM  Result Value Ref Range   WBC 12.3 (H) 4.0 - 10.5 K/uL  RBC 4.81 3.87 - 5.11 MIL/uL   Hemoglobin 13.7 12.0 - 15.0 g/dL   HCT 41.8 36 - 46 %   MCV 86.9 80.0 - 100.0 fL   MCH 28.5 26.0 - 34.0 pg   MCHC 32.8 30.0 - 36.0 g/dL   RDW 12.3 11.5 - 15.5 %   Platelets 259 150 - 400 K/uL   nRBC 0.0 0.0 - 0.2 %    Comment: Performed at [facility], [place] [address]  [place], [place] [address]  I-Stat beta hCG blood, ED     Status: None   Collection Time: 10/23/19 12:10 AM  Result Value Ref Range   I-stat hCG, quantitative <5.0 <5 mIU/mL   Comment 3            Comment:   GEST. AGE      CONC.  (mIU/mL)   <=1 WEEK        5 - 50     2 WEEKS       50 - 500     3 WEEKS       100 - 10,000     4 WEEKS     1,000 - 30,000        FEMALE AND NON-PREGNANT FEMALE:     LESS THAN 5 mIU/mL   Urinalysis, Routine w reflex microscopic     Status: Abnormal   Collection Time: 10/23/19  1:27 AM  Result Value Ref Range   Color, Urine YELLOW YELLOW   APPearance HAZY (A) CLEAR   Specific Gravity, Urine 1.025 1.005 - 1.030   pH 7.0 5.0 - 8.0   Glucose, UA NEGATIVE NEGATIVE mg/dL   Hgb urine dipstick SMALL (A) NEGATIVE   Bilirubin Urine NEGATIVE NEGATIVE   Ketones, ur NEGATIVE NEGATIVE mg/dL   Protein, ur 100 (A) NEGATIVE mg/dL   Nitrite NEGATIVE NEGATIVE   Leukocytes,Ua TRACE (A) NEGATIVE   RBC / HPF 0-5 0 - 5 RBC/hpf   WBC, UA 0-5 0 - 5 WBC/hpf   Bacteria, UA MANY (A) NONE SEEN   Squamous Epithelial / LPF 6-10 0 - 5   Mucus PRESENT     Comment: Performed at [facility], [place] [address], [place], [place] [address]  SARS Coronavirus 2 by RT PCR (hospital order, performed in [facility] hospital lab) Nasopharyngeal Nasopharyngeal Swab     Status: None   Collection Time: 10/23/19  4:55 AM   Specimen: Nasopharyngeal Swab  Result Value Ref Range   SARS Coronavirus 2 NEGATIVE NEGATIVE    Comment: (NOTE) SARS-CoV-2 target nucleic acids are NOT DETECTED.  The SARS-CoV-2 RNA is generally detectable in upper and lower respiratory specimens during the acute phase of infection. The lowest concentration of SARS-CoV-2 viral copies this assay can detect is 250 copies / mL. A negative result does not preclude SARS-CoV-2 infection and should not be used as the sole basis for treatment or other patient management decisions.  A negative result may occur  with improper specimen collection / handling, submission of specimen other than nasopharyngeal swab, presence of viral mutation(s) within the areas targeted by this assay, and inadequate number of viral copies (<250 copies / mL). A negative result must be combined with clinical observations, patient history, and epidemiological information.  Fact Sheet for Patients:   StrictlyIdeas.no  Fact Sheet for Healthcare Providers: BankingDealers.co.za  This test is not yet approved or  cleared by the [place] FDA and has been authorized for detection and/or diagnosis of SARS-CoV-2 by  FDA under an Emergency Use Authorization (EUA).  This EUA will remain in effect (meaning this test can be used) for the duration of the COVID-19 declaration under Section 564(b)(1) of the Act, 21 U.S.C. section 360bbb-3(b)(1), unless the authorization is terminated or revoked sooner.  Performed at [facility], [address], [place], [place] [address]     US Abdomen Limited RUQ  Result Date: 10/23/2019 CLINICAL DATA:  Right upper quadrant abdominal pain EXAM: ULTRASOUND ABDOMEN LIMITED RIGHT UPPER QUADRANT COMPARISON:  October 19, 2019 FINDINGS: Gallbladder: There are gallstones without evidence for gallbladder wall thickening. The sonographic Eulah Pont sign is negative. The gallbladder is distended. There is a stone wedged at the gallbladder neck. Common bile duct: Diameter: 6 mm. Liver: No focal lesion identified. Within normal limits in parenchymal echogenicity. Portal vein is patent on color Doppler imaging with normal direction of blood flow towards the liver. Other: None. IMPRESSION: There is cholelithiasis without secondary signs of acute cholecystitis. The gallbladder is distended with a gallstone wedged at the gallbladder neck. Electronically Signed   By: Katherine Mantle M.D.   On: 10/23/2019 03:07     A/P: Julie Briggs is  an 34 y.o. female with symptomatic cholelithiasis with gallstone in the neck of the gallbladder. Patient at risk for developing cholecystitis.  Her pain is not controlled with current therapies.  I have recommended urgent laparoscopic cholecystectomy.  This was discussed with oncoming surgeon and we will plan on performing the surgery later today.   Vanita Panda, MD  Colorectal and General Surgery [facility]

## 2019-10-23 NOTE — ED Provider Notes (Addendum)
[facility] DEPT Provider Note   CSN: [id number] Arrival date & time: 10/22/19  2319     History Chief Complaint  Patient presents with   Abdominal Pain   Nausea   Emesis    Julie Briggs is a 34 y.o. female with a history of tobacco use, tubal ligation, migraines, and prior cholelithiasis who presents to the emergency department with complaints of abdominal discomfort that began a couple hours prior to arrival.  Patient states that shortly after eating chips that she developed right upper quadrant abdominal pain that radiated through to the back with associated nausea and several episodes of emesis.  Pain is severe, constant, no alleviating or rating factors.  This feels similar to prior ED visit when she was found to have gallstones.  Denies fever, chills, hematemesis, melena, hematochezia, diarrhea, dysuria, or vaginal bleeding/discharge. Patient has not followed up with general surgery yet as she was unsure about possibly surgery.   Chart relates the patient's preferred language is Spanish, however she states she prefers [place] and is able to provide appropriate history without interpreter.  HPI     Past Medical History:  Diagnosis Date   Migraine     There are no problems to display for this patient.   Past Surgical History:  Procedure Laterality Date   TUBAL LIGATION       OB History   No obstetric history on file.     History reviewed. No pertinent family history.  Social History   Tobacco Use   Smoking status: Current Every Day Smoker   Smokeless tobacco: Current User  Substance Use Topics   Alcohol use: Yes   Drug use: Never    Home Medications Prior to Admission medications   Medication Sig Start Date End Date Taking? Authorizing Provider  clotrimazole-betamethasone (LOTRISONE) cream Apply 1 application topically 2 (two) times daily. 10/07/19   Edrick Kins, DPM  FEROSUL 325 (65 Fe) MG tablet  Take 325 mg by mouth daily. 08/12/19   [provider]  furosemide (LASIX) 20 MG tablet Take 20 mg by mouth daily. 08/12/19   [provider]  meloxicam (MOBIC) 15 MG tablet Take 1 tablet (15 mg total) by mouth daily as needed for pain. 10/07/19   Edrick Kins, DPM  methylPREDNISolone (MEDROL DOSEPAK) 4 MG TBPK tablet 6 day dose pack - take as directed 09/09/19   Edrick Kins, DPM  omeprazole (PRILOSEC) 20 MG capsule Take 20 mg by mouth daily. 08/12/19   [provider]  ondansetron (ZOFRAN ODT) 4 MG disintegrating tablet Take 1 tablet (4 mg total) by mouth every 8 (eight) hours as needed for nausea or vomiting. 08/23/19   Zygmund Passero, Aldona Bar R, PA-C  oxyCODONE-acetaminophen (PERCOCET/ROXICET) 5-325 MG tablet Take 1-2 tablets by mouth every 6 (six) hours as needed for severe pain. 08/23/19   Que Meneely R, PA-C  RESTASIS 0.05 % ophthalmic emulsion Place 1 drop into both eyes 2 (two) times daily. 06/29/19   [provider]  terbinafine (LAMISIL) 250 MG tablet Take 1 tablet (250 mg total) by mouth daily. 10/07/19   Edrick Kins, DPM  Vitamin D, Ergocalciferol, (DRISDOL) 1.25 MG (50000 UNIT) CAPS capsule Take 50,000 Units by mouth once a week.  08/12/19   [provider]    Allergies    Iodine  Review of Systems   Review of Systems  Constitutional: Negative for chills and fever.  Respiratory: Negative for shortness of breath.   Cardiovascular: Negative for  chest pain.  Gastrointestinal: Positive for abdominal pain, nausea and vomiting. Negative for blood in stool, constipation and diarrhea.  Genitourinary: Negative for dysuria, frequency, hematuria, urgency, vaginal bleeding and vaginal discharge.  All other systems reviewed and are negative.   Physical Exam Updated Vital Signs BP (!) 116/94 (BP Location: Right Arm)    Pulse 70    Temp 98.2 F (36.8 C) (Oral)    Resp 16    SpO2 100%   Physical Exam Vitals and nursing note reviewed.    Constitutional:      General: She is not in acute distress.    Appearance: She is well-developed. She is not toxic-appearing.  HENT:     Head: Normocephalic and atraumatic.  Eyes:     General:        Right eye: No discharge.        Left eye: No discharge.     Conjunctiva/sclera: Conjunctivae normal.  Cardiovascular:     Rate and Rhythm: Normal rate and regular rhythm.  Pulmonary:     Effort: Pulmonary effort is normal. No respiratory distress.     Breath sounds: Normal breath sounds. No wheezing, rhonchi or rales.  Abdominal:     General: There is no distension.     Palpations: Abdomen is soft.     Tenderness: There is abdominal tenderness in the right upper quadrant and epigastric area. There is no guarding or rebound. Negative signs include Murphy's sign and McBurney's sign.  Musculoskeletal:     Cervical back: Neck supple.  Skin:    General: Skin is warm and dry.     Findings: No rash.  Neurological:     Mental Status: She is alert.     Comments: Clear speech.   Psychiatric:        Behavior: Behavior normal.    ED Results / Procedures / Treatments   Labs (all labs ordered are listed, but only abnormal results are displayed) Labs Reviewed  COMPREHENSIVE METABOLIC PANEL - Abnormal; Notable for the following components:      Result Value   Glucose, Bld 109 (*)    Calcium 8.7 (*)    All other components within normal limits  CBC - Abnormal; Notable for the following components:   WBC 12.3 (*)    All other components within normal limits  URINALYSIS, ROUTINE W REFLEX MICROSCOPIC - Abnormal; Notable for the following components:   APPearance HAZY (*)    Hgb urine dipstick SMALL (*)    Protein, ur 100 (*)    Leukocytes,Ua TRACE (*)    Bacteria, UA MANY (*)    All other components within normal limits  LIPASE, BLOOD  I-STAT BETA HCG BLOOD, ED ([facility], [facility], [facility] ONLY)    EKG None  Radiology US Abdomen Limited RUQ  Result Date: 10/23/2019 CLINICAL DATA:  Right upper  quadrant abdominal pain EXAM: ULTRASOUND ABDOMEN LIMITED RIGHT UPPER QUADRANT COMPARISON:  October 19, 2019 FINDINGS: Gallbladder: There are gallstones without evidence for gallbladder wall thickening. The sonographic Eulah Pont sign is negative. The gallbladder is distended. There is a stone wedged at the gallbladder neck. Common bile duct: Diameter: 6 mm. Liver: No focal lesion identified. Within normal limits in parenchymal echogenicity. Portal vein is patent on color Doppler imaging with normal direction of blood flow towards the liver. Other: None. IMPRESSION: There is cholelithiasis without secondary signs of acute cholecystitis. The gallbladder is distended with a gallstone wedged at the gallbladder neck. Electronically Signed   By: Beryle Quant.D.  On: 10/23/2019 03:07    Procedures Procedures (including critical care time)  Medications Ordered in ED Medications  sodium chloride flush (NS) 0.9 % injection 3 mL (has no administration in time range)  sodium chloride 0.9 % bolus 1,000 mL (has no administration in time range)  ondansetron (ZOFRAN) injection 4 mg (has no administration in time range)  HYDROmorphone (DILAUDID) injection 1 mg (has no administration in time range)    ED Course  I have reviewed the triage vital signs and the nursing notes.  Pertinent labs & imaging results that were available during my care of the patient were reviewed by me and considered in my medical decision making (see chart for details).    MDM Rules/Calculators/A&P                         Patient presents to the ED with complaints of abdominal pain with N/V. Vitals fairly unremarkable. She is nontoxic. TTP to the epigastrium & RUQ. No current peritoneal signs.   Ddx: Symptomatic cholelithiasis, cholecystitis, cholangitis, GERD, PUD, pancreatitis, appendicitis, perforation, obstruction, ectopic pregnancy.  Additional history obtained:  Additional history obtained from nursing note and chart review.   I have seen patient for similar in the past.  Lab Tests:  I Ordered, reviewed, and interpreted labs, which included:  CBC: Mild leukocytosis. No anemia.  CMP: mild hypocalcemia, no significant electrolyte derangement. Renal function preserved. LFTs WNL.  Lipase: WNL Pregnancy test: Negative.  UA: Many bacteria, contaminated some, no urinary sxs- doubt UTI.  Imaging Studies ordered:  I ordered imaging studies which included RUQ [place], I independently visualized and interpreted imaging which showed There is cholelithiasis without secondary signs of acute cholecystitis. The gallbladder is distended with a gallstone wedged at the gallbladder neck. ED Course:  Following initial patient assessment dilaudid ordered for pain, zofran for nausea, fluids for hydration.  02:30: RE-EVAL: Continued discomfort, additional 1mg  of dilaudid ordered.  03:45: RE-EVAL: Pain improved some, currently 7/10 in severity, will trial 3rd dose of dilaudid as she feels that 2nd dose was helping.  04:55: RE-EVAL: Patient had some mild temporary relief but pain has worsened again. Will try fentanyl and discuss with general surgery regarding intractable pain with symptomatic cholelithiasis- patient in agreement.  05:17: CONSULT: Discussed case with general surgeon Dr. - will see patient in the ED. 06:30: Patient care signed out to Maisie Fus PA-C at change of shift pending general surgery evaluation.   Findings and plan of care discussed with supervising physician Dr. Evelena Leyden who is in agreement.   Portions of this note were generated with Judd Lien. Dictation errors may occur despite best attempts at proofreading.  Final Clinical Impression(s) / ED Diagnoses Final diagnoses:  Abdominal pain  Symptomatic cholelithiasis    Rx / DC Orders ED Discharge Orders    None       Scientist, clinical (histocompatibility and immunogenetics), PA-C 10/23/19 12/23/19  Tikita Elisabetta Mishra was evaluated in Emergency Department on 10/23/2019 for  the symptoms described in the history of present illness. He/she was evaluated in the context of the global COVID-19 pandemic, which necessitated consideration that the patient might be at risk for infection with the SARS-CoV-2 virus that causes COVID-19. Institutional protocols and algorithms that pertain to the evaluation of patients at risk for COVID-19 are in a state of rapid change based on information released by regulatory bodies including the CDC and federal and state organizations. These policies and algorithms were followed during the patient's care in  the ED.    Desmond Lope 10/23/19 2229    Geoffery Lyons, MD 10/24/19 [phone]

## 2019-10-23 NOTE — ED Notes (Signed)
Short stay staff here to transport pt.

## 2019-10-23 NOTE — Interval H&P Note (Signed)
History and Physical Interval Note:  10/23/2019 8:26 AM  Julie Briggs  has presented today for surgery, with the diagnosis of cholecystitis.  The various methods of treatment have been discussed with the patient and family. After consideration of risks, benefits and other options for treatment, the patient has consented to  Procedure(s): LAPAROSCOPIC CHOLECYSTECTOMY (N/A) as a surgical intervention.  The patient's history has been reviewed, patient examined, no change in status, stable for surgery.  I have reviewed the patient's chart and labs.  Questions were answered to the patient's satisfaction.    Pt seen & examined Persistent epigastric/ruq pain rad to back still requiring iv pain meds in ed. Along with nausea Milder episodes in past that would resolve with oral narcotics  Uncomfortable appearing Cta  Reg Soft, TTP RUQ. No peritonitis  I believe the patient's symptoms are consistent with gallbladder disease.  We discussed gallbladder disease. The patient was given Agricultural engineer. We discussed non-operative and operative management. We discussed the signs & symptoms of acute cholecystitis  I discussed laparoscopic cholecystectomy with IOC in detail.  The patient was shown diagrams detailing the procedure.  We discussed the risks and benefits of a laparoscopic cholecystectomy including, but not limited to bleeding, infection, injury to surrounding structures such as the intestine or liver, bile leak, retained gallstones, need to convert to an open procedure, prolonged diarrhea, blood clots such as  DVT, common bile duct injury, anesthesia risks, and possible need for additional procedures.  We discussed the typical post-operative recovery course. I explained that the likelihood of improvement of their symptoms is good.  [name]. Andrey Campanile, MD, FACS General, Bariatric, & Minimally Invasive Surgery [facility]   Julie Briggs

## 2019-10-24 ENCOUNTER — Encounter (HOSPITAL_COMMUNITY): Payer: Self-pay | Admitting: General Surgery

## 2019-10-24 MED ORDER — OXYCODONE HCL 5 MG PO TABS: 5.0000 mg | ORAL_TABLET | Freq: Three times a day (TID) | ORAL | 0 refills | Status: DC | PRN

## 2019-10-24 MED ORDER — DOCUSATE SODIUM 100 MG PO CAPS
100.0000 mg | ORAL_CAPSULE | Freq: Two times a day (BID) | ORAL | 0 refills | Status: AC
Start: 2019-10-24 — End: 2019-11-23

## 2019-10-24 NOTE — Progress Notes (Signed)
Nurse reviewed discharge instructions with pt. Pt verbalized understanding of discharge instructions, follow up appointment and new medications.  Instructions given in both spanish and english.  All questions answered prior to discharge.

## 2019-10-24 NOTE — Discharge Summary (Signed)
Physician Discharge Summary  Patient ID: Julie Briggs MRN: [id number] DOB/AGE: 1985-12-05 34 y.o.  Admit date: 10/22/2019 Discharge date: 10/24/2019  Admission Diagnoses: acute cholecystitis  Discharge Diagnoses:  Active Problems:   Acute cholecystitis   Discharged Condition: good  Hospital Course: Admitted for observation following laparoscopic cholecystectomy for acute cholecystitis. Uncomplicated post-op course.    Discharge Exam: Blood pressure 126/77, pulse 63, temperature 98.3 F (36.8 C), temperature source Oral, resp. rate 16, SpO2 100 %. General appearance: alert and cooperative Resp: unlabored GI: soft, appropriately tender. incisions c/d/i with dressings intact, no cellulitis or hematoma.  Disposition: Discharge disposition: 01-Home or Self Care       Discharge Instructions    Diet - low sodium heart healthy   Complete by: As directed    Increase activity slowly   Complete by: As directed      Allergies as of 10/24/2019      Reactions   Iodine Hives      Medication List    TAKE these medications   clotrimazole-betamethasone cream Commonly known as: Lotrisone Apply 1 application topically 2 (two) times daily.   docusate sodium 100 MG capsule Commonly known as: Colace Take 1 capsule (100 mg total) by mouth 2 (two) times daily. Okay to decrease to once daily or stop taking if having loose bowel movements   FeroSul 325 (65 FE) MG tablet Generic drug: ferrous sulfate Take 325 mg by mouth daily.   furosemide 20 MG tablet Commonly known as: LASIX Take 20 mg by mouth daily.   meloxicam 15 MG tablet Commonly known as: MOBIC Take 1 tablet (15 mg total) by mouth daily as needed for pain.   oxyCODONE 5 MG immediate release tablet Commonly known as: Oxy IR/ROXICODONE Take 1 tablet (5 mg total) by mouth every 8 (eight) hours as needed for severe pain. Alternate tylenol and ibuprofen scheduled for the first few days, then take as needed.  Take this medication for pain not relieved by tylenol or ibuprofen.   Restasis 0.05 % ophthalmic emulsion Generic drug: cycloSPORINE Place 1 drop into both eyes 2 (two) times daily.   terbinafine 250 MG tablet Commonly known as: LamISIL Take 1 tablet (250 mg total) by mouth daily.   Vitamin D (Ergocalciferol) 1.25 MG (50000 UNIT) Caps capsule Commonly known as: DRISDOL Take 50,000 Units by mouth once a week.       Follow-up Information    [facility], [place]. Go on 11/10/2019.   Specialty: General Surgery Why: at 11:45 AM for post-operative follow up. please arrive 30 minues early to get checked in and fillout any necessary paperwork. Contact information: [address] [place] [address] [phone]              Signed: Berna Bue 10/24/2019, 6:52 AM

## 2019-10-24 NOTE — Evaluation (Signed)
Physical Therapy Evaluation Patient Details Name: Julie Briggs MRN: [id number] DOB: 1985/06/19 Today's Date: 10/24/2019   History of Present Illness  Pt s/p lap chole  Clinical Impression  Pt s/p lap chole and presenting with functional mobility limitations 2* post op pain and mild ambulatory balance deficits.  Pt reporting mild dizziness with ambulation but not in lying/sitting.  BP sitting 127/83, standing 118/84 and after ambulating 120/77 - RN aware.  Pt should progress to dc home with family assist.    Follow Up Recommendations No PT follow up    Equipment Recommendations  None recommended by PT    Recommendations for Other Services       Precautions / Restrictions Precautions Precautions: Fall Restrictions Weight Bearing Restrictions: No      Mobility  Bed Mobility Overal bed mobility: Needs Assistance             General bed mobility comments: Pt up on window seat on arrival and requests to return to same.  Pt states she has been rolling OOB with min difficulty  Transfers Overall transfer level: Needs assistance Equipment used: Rolling walker (2 wheeled) Transfers: Sit to/from Stand Sit to Stand: Supervision         General transfer comment: increased time 2* discomfort  Ambulation/Gait Ambulation/Gait assistance: Min guard;Supervision Gait Distance (Feet): 200 Feet Assistive device: None Gait Pattern/deviations: Step-through pattern;Decreased step length - right;Decreased step length - left;Shuffle;Trunk flexed;Wide base of support Gait velocity: decr   General Gait Details: mild general instability with increased [employer] knee flexion; no LOB; pt reports mild dizziness BP120/77  Stairs Stairs: Yes Stairs assistance: Min guard;Supervision Stair Management: One rail Right;Forwards;Step to pattern Number of Stairs: 10 General stair comments: min cues for sequence  Wheelchair Mobility    Modified Rankin (Stroke Patients Only)        Balance Overall balance assessment: Mild deficits observed, not formally tested                                           Pertinent Vitals/Pain Pain Assessment: Faces Faces Pain Scale: Hurts little more Pain Location: abdomen with movement Pain Descriptors / Indicators: Grimacing;Guarding;Sore Pain Intervention(s): Limited activity within patient's tolerance;Monitored during session;Premedicated before session    Early expects to be discharged to:: Private residence Living Arrangements: Spouse/significant other;Children Available Help at Discharge: Family Type of Home: House Home Access: Stairs to enter   Technical brewer of Steps: 1 Home Layout: Two level Home Equipment: None      Prior Function Level of Independence: Independent               Hand Dominance        Extremity/Trunk Assessment   Upper Extremity Assessment Upper Extremity Assessment: Overall WFL for tasks assessed    Lower Extremity Assessment Lower Extremity Assessment: Overall WFL for tasks assessed    Cervical / Trunk Assessment Cervical / Trunk Assessment: Normal  Communication   Communication: No difficulties  Cognition Arousal/Alertness: Awake/alert Behavior During Therapy: WFL for tasks assessed/performed Overall Cognitive Status: Within Functional Limits for tasks assessed                                        General Comments      Exercises     Assessment/Plan  PT Assessment Patient needs continued PT services  PT Problem List Decreased activity tolerance;Decreased balance;Decreased mobility;Decreased knowledge of use of DME;Pain       PT Treatment Interventions DME instruction;Gait training;Stair training;Functional mobility training;Therapeutic activities;Therapeutic exercise;Patient/family education    PT Goals (Current goals can be found in the Care Plan section)  Acute Rehab PT Goals Patient  Stated Goal: Decreased pain and back to work PT Goal Formulation: With patient Time For Goal Achievement: 10/31/19 Potential to Achieve Goals: Good    Frequency Min 3X/week   Barriers to discharge        Co-evaluation               AM-PAC PT "6 Clicks" Mobility  Outcome Measure Help needed turning from your back to your side while in a flat bed without using bedrails?: None Help needed moving from lying on your back to sitting on the side of a flat bed without using bedrails?: None Help needed moving to and from a bed to a chair (including a wheelchair)?: None Help needed standing up from a chair using your arms (e.g., wheelchair or bedside chair)?: A Little Help needed to walk in hospital room?: A Little Help needed climbing 3-5 steps with a railing? : A Little 6 Click Score: 21    End of Session Equipment Utilized During Treatment: Gait belt Activity Tolerance: Patient tolerated treatment well Patient left: Other (comment) (window seat) Nurse Communication: Mobility status;Other (comment) (mild dizziness with OOB activity) PT Visit Diagnosis: Unsteadiness on feet (R26.81);Difficulty in walking, not elsewhere classified (R26.2);Pain    Time: 8546-2703 PT Time Calculation (min) (ACUTE ONLY): 20 min   Charges:   PT Evaluation $PT Eval Low Complexity: 1 Low          Mauro Kaufmann PT Acute Rehabilitation Services Pager [phone] Office [phone]   Eriverto Byrnes 10/24/2019, 12:20 PM

## 2019-10-26 ENCOUNTER — Ambulatory Visit: Payer: Medicaid Other | Admitting: Physical Therapy

## 2019-10-26 LAB — SURGICAL PATHOLOGY

## 2019-11-02 ENCOUNTER — Encounter: Payer: Medicaid Other | Admitting: Physical Therapy

## 2019-11-09 ENCOUNTER — Encounter: Payer: Medicaid Other | Admitting: Physical Therapy

## 2019-11-17 ENCOUNTER — Ambulatory Visit: Payer: Medicaid Other | Attending: Podiatry | Admitting: Physical Therapy

## 2019-11-17 ENCOUNTER — Telehealth: Payer: Self-pay | Admitting: Physical Therapy

## 2019-11-17 NOTE — Telephone Encounter (Signed)
Patient contacted due to missing PT appointment. She stated she had the dates mixed up and believed her appointment was tomorrow. This was the only scheduled appointment the patient had so she was instructed to contact the front desk to schedule a future appointment and/or be placed on the wait list. Patient expressed understanding.

## 2019-11-18 ENCOUNTER — Ambulatory Visit: Payer: Medicaid Other | Admitting: Podiatry

## 2020-04-14 ENCOUNTER — Encounter: Payer: Self-pay | Admitting: General Practice

## 2020-05-05 ENCOUNTER — Ambulatory Visit (INDEPENDENT_AMBULATORY_CARE_PROVIDER_SITE_OTHER): Payer: Medicaid Other | Admitting: Advanced Practice Midwife

## 2020-05-05 ENCOUNTER — Other Ambulatory Visit (HOSPITAL_COMMUNITY)
Admission: RE | Admit: 2020-05-05 | Discharge: 2020-05-05 | Disposition: A | Discharge status: Home / Self Care | Payer: Medicaid Other | Source: Ambulatory Visit | Attending: Obstetrics and Gynecology | Admitting: Obstetrics and Gynecology

## 2020-05-05 ENCOUNTER — Other Ambulatory Visit: Payer: Self-pay

## 2020-05-05 ENCOUNTER — Telehealth: Payer: Self-pay

## 2020-05-05 ENCOUNTER — Encounter: Payer: Self-pay | Admitting: Advanced Practice Midwife

## 2020-05-05 VITALS — BP 133/89 | HR 84 | Temp 98.0°F | Ht 62.0 in | Wt 179.0 lb

## 2020-05-05 DIAGNOSIS — Z01419 Encounter for gynecological examination (general) (routine) without abnormal findings: Secondary | ICD-10-CM

## 2020-05-05 DIAGNOSIS — Z113 Encounter for screening for infections with a predominantly sexual mode of transmission: Secondary | ICD-10-CM | POA: Diagnosis not present

## 2020-05-05 DIAGNOSIS — Z8742 Personal history of other diseases of the female genital tract: Secondary | ICD-10-CM | POA: Diagnosis not present

## 2020-05-05 DIAGNOSIS — K5900 Constipation, unspecified: Secondary | ICD-10-CM | POA: Diagnosis not present

## 2020-05-05 MED ORDER — DOCUSATE SODIUM 100 MG PO CAPS: 100.0000 mg | ORAL_CAPSULE | Freq: Two times a day (BID) | ORAL | 2 refills | Status: AC | PRN

## 2020-05-05 MED ORDER — POLYETHYLENE GLYCOL 3350 17 G PO PACK: 17.0000 g | PACK | Freq: Every day | ORAL | 0 refills | Status: AC

## 2020-05-05 NOTE — Patient Instructions (Signed)
Preventive Care 20-34 Years Old, Female Preventive care refers to visits with your health care provider and lifestyle choices that can promote health and wellness. This includes:  A yearly physical exam. This may also be called an annual well check.  Regular dental visits and eye exams.  Immunizations.  Screening for certain conditions.  Healthy lifestyle choices, such as eating a healthy diet, getting regular exercise, not using drugs or products that contain nicotine and tobacco, and limiting alcohol use. What can I expect for my preventive care visit? Physical exam Your health care provider will check your:  Height and weight. This may be used to calculate body mass index (BMI), which tells if you are at a healthy weight.  Heart rate and blood pressure.  Skin for abnormal spots. Counseling Your health care provider may ask you questions about your:  Alcohol, tobacco, and drug use.  Emotional well-being.  Home and relationship well-being.  Sexual activity.  Eating habits.  Work and work Statistician.  Method of birth control.  Menstrual cycle.  Pregnancy history. What immunizations do I need?  Influenza (flu) vaccine  This is recommended every year. Tetanus, diphtheria, and pertussis (Tdap) vaccine  You may need a Td booster every 10 years. Varicella (chickenpox) vaccine  You may need this if you have not been vaccinated. Human papillomavirus (HPV) vaccine  If recommended by your health care provider, you may need three doses over 6 months. Measles, mumps, and rubella (MMR) vaccine  You may need at least one dose of MMR. You may also need a second dose. Meningococcal conjugate (MenACWY) vaccine  One dose is recommended if you are age 75-21 years and a first-year college student living in a residence hall, or if you have one of several medical conditions. You may also need additional booster doses. Pneumococcal conjugate (PCV13) vaccine  You may need  this if you have certain conditions and were not previously vaccinated. Pneumococcal polysaccharide (PPSV23) vaccine  You may need one or two doses if you smoke cigarettes or if you have certain conditions. Hepatitis A vaccine  You may need this if you have certain conditions or if you travel or work in places where you may be exposed to hepatitis A. Hepatitis B vaccine  You may need this if you have certain conditions or if you travel or work in places where you may be exposed to hepatitis B. Haemophilus influenzae type b (Hib) vaccine  You may need this if you have certain conditions. You may receive vaccines as individual doses or as more than one vaccine together in one shot (combination vaccines). Talk with your health care provider about the risks and benefits of combination vaccines. What tests do I need?  Blood tests  Lipid and cholesterol levels. These may be checked every 5 years starting at age 33.  Hepatitis C test.  Hepatitis B test. Screening  Diabetes screening. This is done by checking your blood sugar (glucose) after you have not eaten for a while (fasting).  Sexually transmitted disease (STD) testing.  BRCA-related cancer screening. This may be done if you have a family history of breast, ovarian, tubal, or peritoneal cancers.  Pelvic exam and Pap test. This may be done every 3 years starting at age 76. Starting at age 102, this may be done every 5 years if you have a Pap test in combination with an HPV test. Talk with your health care provider about your test results, treatment options, and if necessary, the need for more tests.  Follow these instructions at home: Eating and drinking   Eat a diet that includes fresh fruits and vegetables, whole grains, lean protein, and low-fat dairy.  Take vitamin and mineral supplements as recommended by your health care provider.  Do not drink alcohol if: ? Your health care provider tells you not to drink. ? You are  pregnant, may be pregnant, or are planning to become pregnant.  If you drink alcohol: ? Limit how much you have to 0-1 drink a day. ? Be aware of how much alcohol is in your drink. In the [place] one drink equals one 12 oz bottle of beer (355 mL), one 5 oz glass of wine (148 mL), or one 1 oz glass of hard liquor (44 mL). Lifestyle  Take daily care of your teeth and gums.  Stay active. Exercise for at least 30 minutes on 5 or more days each week.  Do not use any products that contain nicotine or tobacco, such as cigarettes, e-cigarettes, and chewing tobacco. If you need help quitting, ask your health care provider.  If you are sexually active, practice safe sex. Use a condom or other form of birth control (contraception) in order to prevent pregnancy and STIs (sexually transmitted infections). If you plan to become pregnant, see your health care provider for a preconception visit. What's next?  Visit your health care provider once a year for a well check visit.  Ask your health care provider how often you should have your eyes and teeth checked.  Stay up to date on all vaccines. This information is not intended to replace advice given to you by your health care provider. Make sure you discuss any questions you have with your health care provider. Document Revised: 01/09/2018 Document Reviewed: 01/09/2018 Elsevier Patient Education  2020 [employer].

## 2020-05-05 NOTE — Progress Notes (Signed)
GYNECOLOGY ANNUAL PREVENTATIVE CARE ENCOUNTER NOTE  History:     Charonda Mikaila Grunert is a 34 y.o. [phone] female here for a routine annual gynecologic exam.  Current complaints: none.   Denies abnormal vaginal bleeding, discharge, pelvic pain, problems with intercourse or other gynecologic concerns.     Patient endorses abnormal pap smear in [place] in 2008. She was pregnant at the time and reports a normal postpartum pap. Her medical team conducted paps q 3 months for one year and q 6 months for the second year, all with normal results. Since that time she has had yearly paps, all normal. Most recent pap about five years ago.  Patient complains of constipation, onset in childhood. She is attempting management with Colace but has not experienced relief.  Lives with children, in school for [employer] and will graduate next fall.  Female partner, non-penetrative sex. Vapes 2-3 times per week.   Gynecologic History Patient's last menstrual period was 04/18/2020 (approximate). Contraception: tubal ligation Last Pap: 2015. Results were: normal with negative HPV   Obstetric History OB History  Gravida Para Term Preterm AB Living  6 5 5   1 5   SAB IAB Ectopic Multiple Live Births  1       5    # Outcome Date GA Lbr Len/2nd Weight Sex Delivery Anes PTL Lv  6 Term 01/08/13 [redacted]w[redacted]d  8 lb 5 oz (3.771 kg) F Vag-Spont EPI N LIV  5 Term 04/17/10 [redacted]w[redacted]d  8 lb 3 oz (3.714 kg) M Vag-Spont EPI N LIV  4 Term 09/23/08 [redacted]w[redacted]d  7 lb 8 oz (3.402 kg) F Vag-Spont   LIV     Birth Comments: Baby measured long-induction scheduled  3 Term 09/29/07 [redacted]w[redacted]d  9 lb 10 oz (4.366 kg) F Vag-Spont EPI N LIV  2 Term 01/29/03 [redacted]w[redacted]d  8 lb 7 oz (3.827 kg) F Vag-Spont EPI N LIV  1 SAB 03/28/01 [redacted]w[redacted]d           Past Medical History:  Diagnosis Date  . Anemia   . Fluid retention   . Migraine     Past Surgical History:  Procedure Laterality Date  . CHOLECYSTECTOMY N/A 10/23/2019   Procedure: LAPAROSCOPIC  CHOLECYSTECTOMY;  Surgeon: 12/23/2019, MD;  Location: [facility];  Service: General;  Laterality: N/A;  . TUBAL LIGATION      Current Outpatient Medications on File Prior to Visit  Medication Sig Dispense Refill  . FEROSUL 325 (65 Fe) MG tablet Take 325 mg by mouth daily.    . furosemide (LASIX) 20 MG tablet Take 20 mg by mouth daily.    . meloxicam (MOBIC) 15 MG tablet Take 1 tablet (15 mg total) by mouth daily as needed for pain. 30 tablet 1  . RESTASIS 0.05 % ophthalmic emulsion Place 1 drop into both eyes 2 (two) times daily.    Gaynelle Adu terbinafine (LAMISIL) 250 MG tablet Take 1 tablet (250 mg total) by mouth daily. 60 tablet 0  . Vitamin D, Ergocalciferol, (DRISDOL) 1.25 MG (50000 UNIT) CAPS capsule Take 50,000 Units by mouth once a week.      No current facility-administered medications on file prior to visit.    Allergies  Allergen Reactions  . Iodine Hives    Social History:  reports that she has never smoked. She uses smokeless tobacco. She reports current alcohol use. She reports that she does not use drugs.  No family history on file.  The following portions of the patient's history were  reviewed and updated as appropriate: allergies, current medications, past family history, past medical history, past social history, past surgical history and problem list.  Review of Systems Pertinent items noted in HPI and remainder of comprehensive ROS otherwise negative.  Physical Exam:  BP 133/89 (BP Location: Left Arm, Patient Position: Sitting, Cuff Size: Normal)   Pulse 84   Temp 98 F (36.7 C) (Oral)   Ht 5\' 2"  (1.575 m)   Wt 179 lb (81.2 kg)   LMP 04/18/2020 (Approximate)   BMI 32.74 kg/m  CONSTITUTIONAL: Well-developed, well-nourished female in no acute distress.  HENT:  Normocephalic, atraumatic, External right and left ear normal.  EYES: Conjunctivae and EOM are normal. Pupils are equal, round, and reactive to light. No scleral icterus.  NECK: Normal range of motion, supple,  no masses.    SKIN: Skin is warm and dry. No rash noted. Not diaphoretic. No erythema. No pallor. MUSCULOSKELETAL: Normal range of motion. No tenderness.  No cyanosis, clubbing, or edema.   NEUROLOGIC: Alert and oriented to person, place, and time. Normal reflexes, muscle tone coordination.  PSYCHIATRIC: Normal mood and affect. Normal behavior. Normal judgment and thought content. CARDIOVASCULAR: Normal heart rate noted, regular rhythm RESPIRATORY: Clear to auscultation bilaterally. Effort and breath sounds normal, no problems with respiration noted. BREASTS: Symmetric in size. No masses, tenderness, skin changes, nipple drainage, or lymphadenopathy bilaterally. Performed in the presence of a chaperone. ABDOMEN: Soft, no distention noted.  No tenderness, rebound or guarding.  PELVIC: Normal appearing external genitalia and urethral meatus; normal appearing vaginal mucosa and cervix.  No abnormal discharge noted.  Pap smear obtained.  Normal uterine size, no other palpable masses, no uterine or adnexal tenderness.  Performed in the presence of a chaperone.   Assessment and Plan:    1. Encounter for routine gynecological examination with Papanicolaou smear of cervix - No concerning findings on physical exam - Cytology - PAP( [place]) - Cervicovaginal ancillary only( [place]) - RPR+HBsAg+HCVAb+...  2. Screen for STD (sexually transmitted disease)  - Cervicovaginal ancillary only( [place]) - RPR+HBsAg+HCVAb+...  3. Constipation, unspecified constipation type - Chronic since childhood - Continue Colace BID, start Miralax - Increase water, limit caffeine - Add in cardiovascular exercise to facilitate normal motility - Currently no pain  - polyethylene glycol (MIRALAX) 17 g packet; Take 17 g by mouth daily.  Dispense: 14 each; Refill: 0  4. Hx of abnormal cervical Pap smear   Will follow up results of pap smear and manage accordingly. Routine preventative health maintenance  measures emphasized. Please refer to After Visit Summary for other counseling recommendations.      14/10/2019, MSN, CNM Certified Nurse Midwife, [facility] 05/05/20 4:39 PM

## 2020-05-06 LAB — RPR+HBSAG+HCVAB+...
HIV Screen 4th Generation wRfx: NONREACTIVE
Hep C Virus Ab: <0.1 s/co ratio (ref 0.0–0.9)
Hepatitis B Surface Ag: NEGATIVE
RPR Ser Ql: NONREACTIVE

## 2020-05-09 LAB — CERVICOVAGINAL ANCILLARY ONLY
Bacterial Vaginitis (gardnerella): NEGATIVE
Candida Glabrata: NEGATIVE
Candida Vaginitis: NEGATIVE
Chlamydia: NEGATIVE
Comment: NEGATIVE
Comment: NEGATIVE
Comment: NEGATIVE
Comment: NEGATIVE
Comment: NEGATIVE
Comment: NORMAL
Neisseria Gonorrhea: NEGATIVE
Trichomonas: NEGATIVE

## 2020-05-12 LAB — CYTOLOGY - PAP
Comment: NEGATIVE
Diagnosis: NEGATIVE
High risk HPV: NEGATIVE

## 2020-06-06 ENCOUNTER — Telehealth: Payer: Self-pay | Admitting: *Deleted

## 2020-06-06 NOTE — Telephone Encounter (Signed)
Patient left message with the after hour triage line on Friday 06/03/20. Reported spotting for days after PAP test and then started her normal cycle. A week later she started spotting again then had another cycle. Normal cycle 05/12/2020-05/19/2020. Spotting again 05/24/2020 with brownish to pink up until 06/01/2020, then started her cycle again. A week earlier than normal.  Please advise.  Clovis Pu, RN

## 2021-04-24 ENCOUNTER — Ambulatory Visit: Payer: Medicaid Other | Admitting: Podiatry

## 2021-04-24 ENCOUNTER — Other Ambulatory Visit: Payer: Self-pay

## 2021-04-26 ENCOUNTER — Ambulatory Visit (INDEPENDENT_AMBULATORY_CARE_PROVIDER_SITE_OTHER): Payer: Medicaid Other | Admitting: Podiatry

## 2021-04-26 ENCOUNTER — Other Ambulatory Visit: Payer: Self-pay

## 2021-04-26 DIAGNOSIS — M7661 Achilles tendinitis, right leg: Secondary | ICD-10-CM | POA: Diagnosis not present

## 2021-04-26 MED ORDER — METHYLPREDNISOLONE 4 MG PO TBPK: ORAL_TABLET | ORAL | 0 refills | Status: AC

## 2021-04-26 MED ORDER — MELOXICAM 15 MG PO TABS: 15.0000 mg | ORAL_TABLET | Freq: Every day | ORAL | 1 refills | Status: AC

## 2021-05-02 MED ORDER — BETAMETHASONE SOD PHOS & ACET 6 (3-3) MG/ML IJ SUSP
3.0000 mg | Freq: Once | INTRAMUSCULAR | Status: AC
  Administered 2021-05-02: 19:00:00 3 mg via INTRA_ARTICULAR

## 2021-05-02 NOTE — Progress Notes (Signed)
° °  HPI: 35 y.o. 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  Patient states that last visit on 10/07/2019 when physical therapy was recommended she only did one week of physical therapy and she was released with home physical therapy exercises.  She presents today for follow-up treatment and evaluation  Past Medical History:  Diagnosis Date   Anemia    Fluid retention    Migraine     Past Surgical History:  Procedure Laterality Date   CHOLECYSTECTOMY N/A 10/23/2019   Procedure: LAPAROSCOPIC CHOLECYSTECTOMY;  Surgeon: Gaynelle Adu, MD;  Location: [facility];  Service: General;  Laterality: N/A;   TUBAL LIGATION      Allergies  Allergen Reactions   Iodine Hives     Physical Exam: General: The patient is alert and oriented x3 in no acute distress.  Dermatology: Skin is warm, dry and supple bilateral lower extremities. Negative for open lesions or macerations.  There continues to be some hyperkeratotic onychodystrophy of the toenails 1-5 bilateral  Vascular: Palpable pedal pulses bilaterally. Capillary refill within normal limits.  Negative for any significant edema or erythema  Neurological: Light touch and protective threshold grossly intact  Musculoskeletal Exam: No pedal deformities noted.  There is some pain on palpation along the Achilles tendon right lower extremity at the insertion of the posterior tubercle of the calcaneus   Assessment: 1.  Insertional Achilles tendinitis right   Plan of Care:  1. Patient evaluated. 2.  Injection of 0.5 cc Celestone Soluspan injected along the retrocalcaneal bursa right.  Care was taken to avoid injection into the Achilles tendon 3.  Cam boot dispensed.  Weightbearing as tolerated x3 weeks 4.  Prescription for Medrol Dosepak 5.  Prescription for meloxicam 15 mg daily after completion of the Dosepak 6.  Return to clinic 4 weeks       Felecia Shelling, DPM [facility]  Dr. Felecia Shelling, DPM    [address] [place], [place] [address]                Office [phone]  Fax [phone]

## 2021-06-26 IMAGING — US US ABDOMEN LIMITED
1 series · 14 of 25 positions shown · non-contrast
Comparison: 08/23/2019

CLINICAL DATA: Follow-up gallstones

EXAM:
ULTRASOUND ABDOMEN LIMITED RIGHT UPPER QUADRANT

[Series 1: us abdomen limited · 0.21mm/px · 14 of 44 slices shown]
[im 1/44]
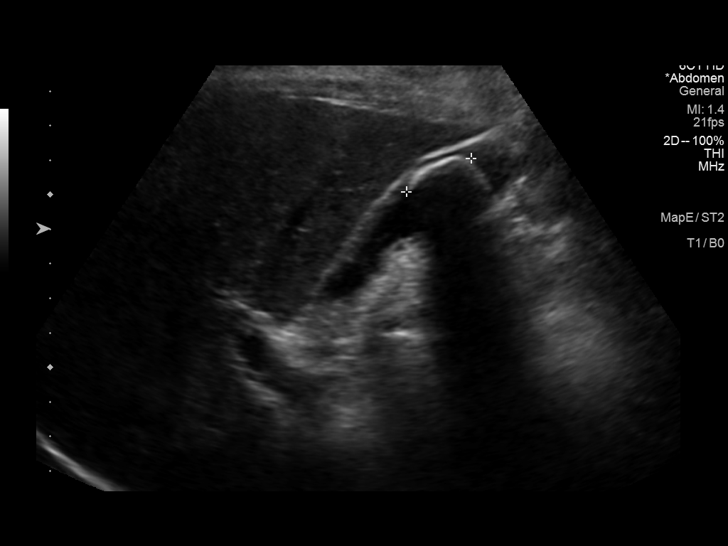
[im 4/44]
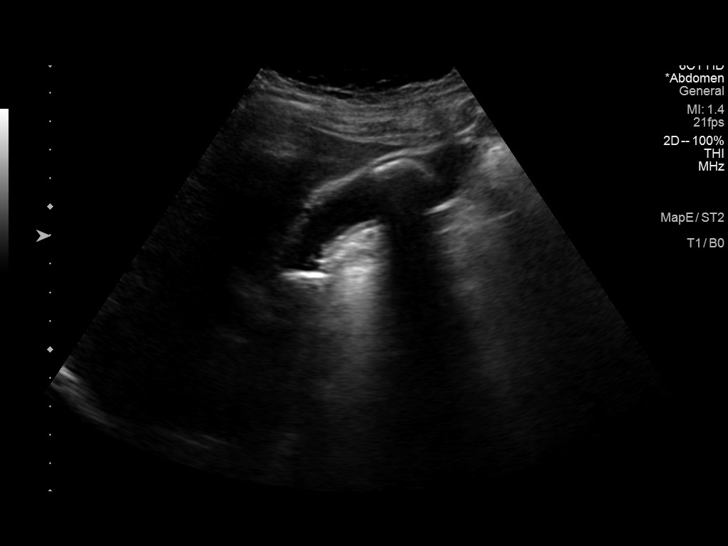
[im 8/44]
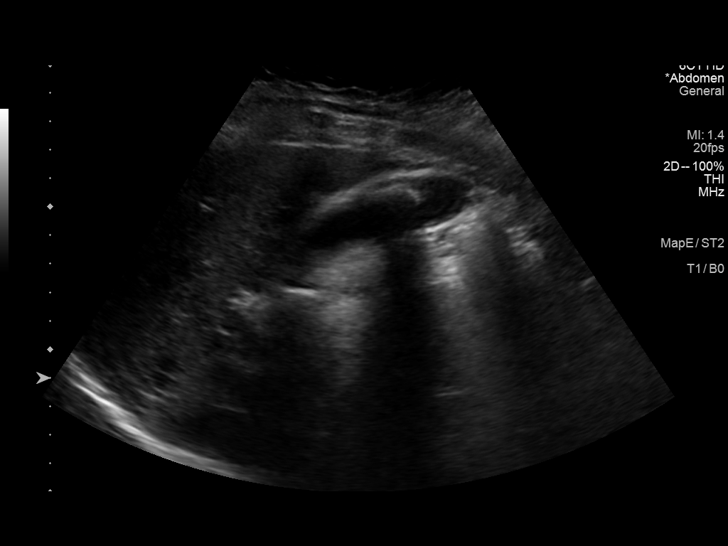
[im 11/44]
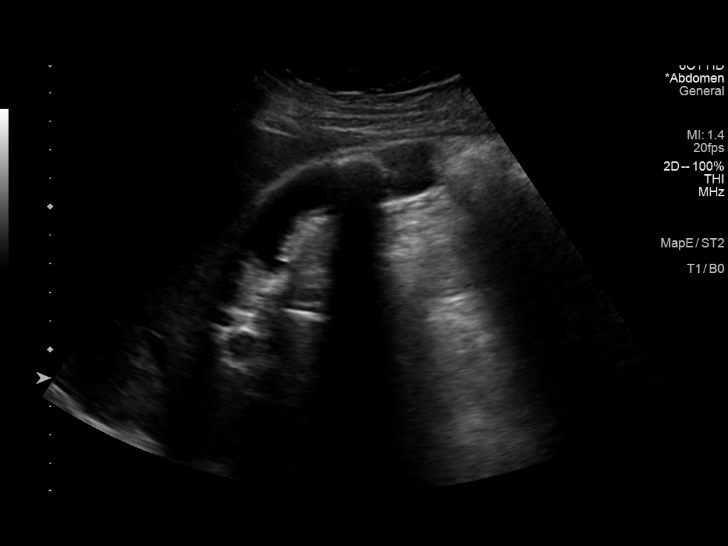
[im 15/44]
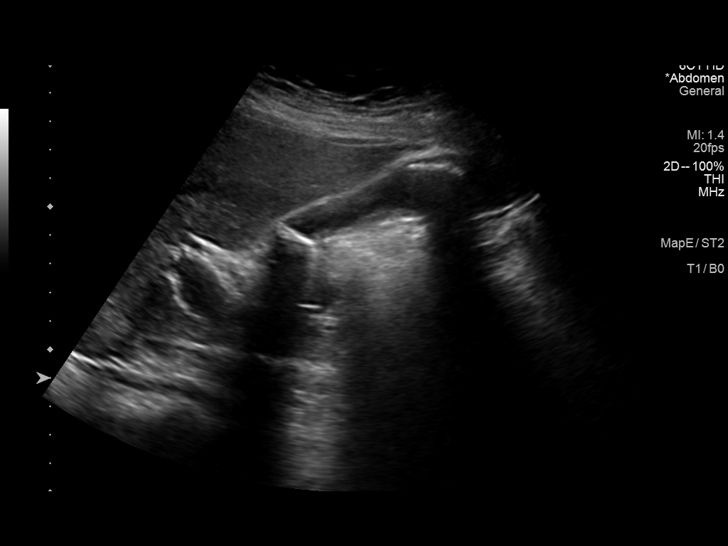
[im 17/44]
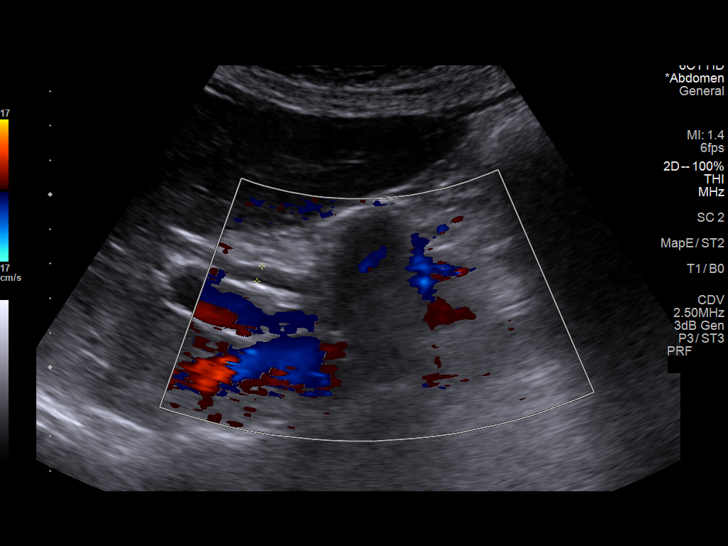
[im 20/44]
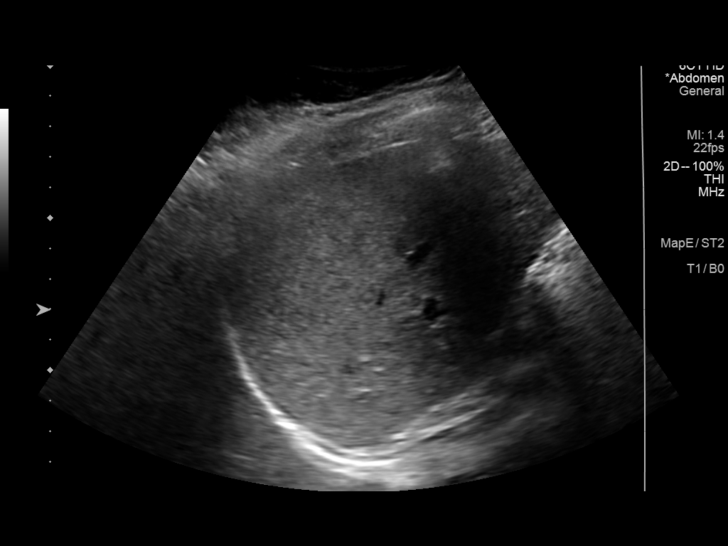
[im 24/44]
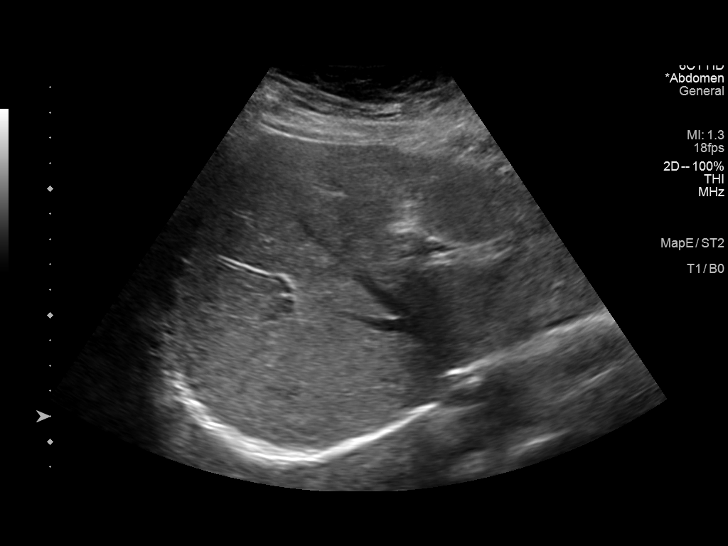
[im 27/44]
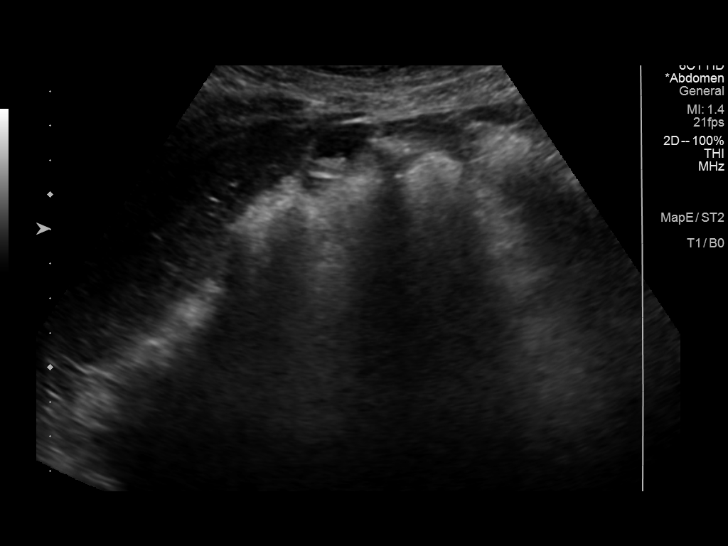
[im 29/44]
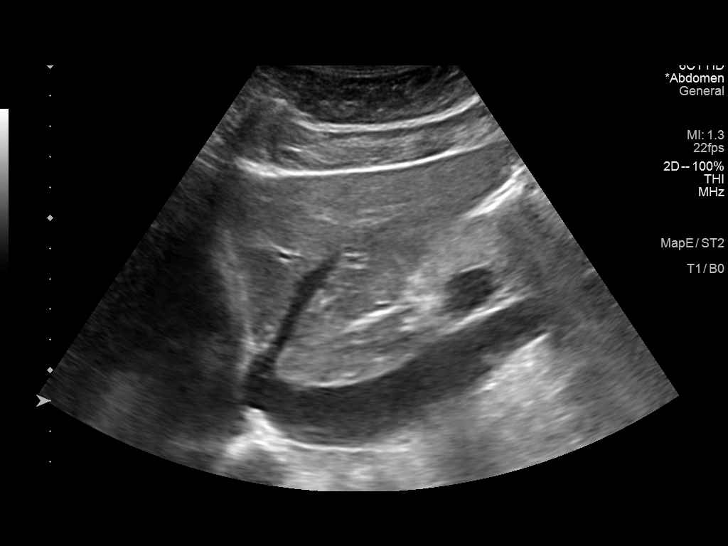
[im 33/44]
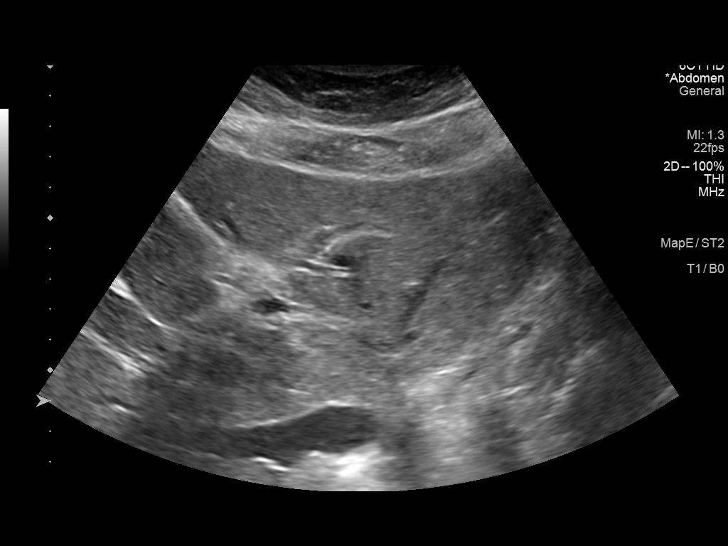
[im 36/44]
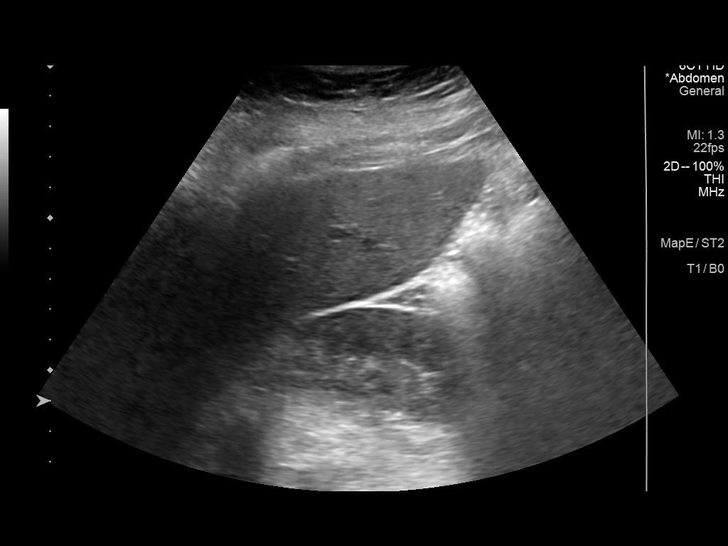
[im 40/44]
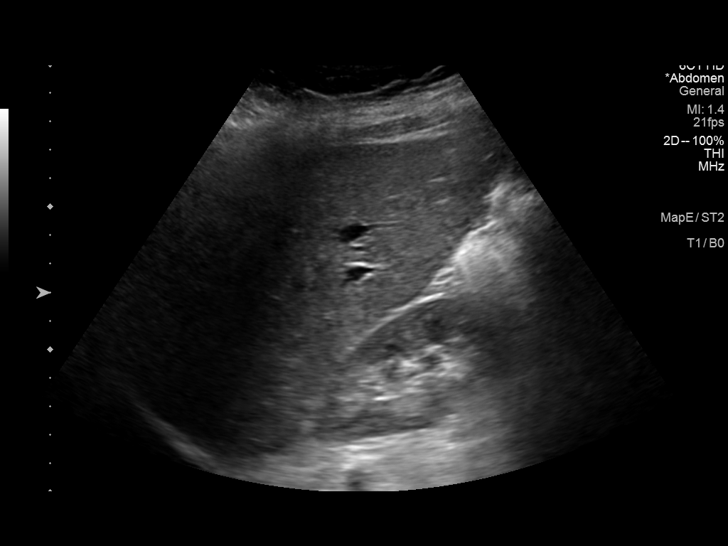
[im 44/44]
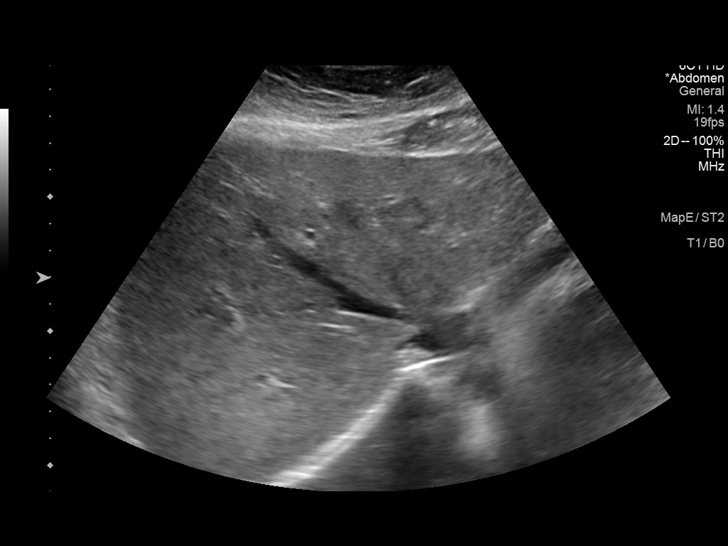

[14 of 25 positions shown; findings below may reference images not displayed]

FINDINGS: Gallbladder:

Mildly decompressed when compare with the prior exam. Multiple
gallstones are seen 1 in the region of the gallbladder neck similar
to that noted on prior exam. No pericholecystic fluid is seen. No
wall thickening is noted.

Common bile duct:

Diameter: 5.2 mm.

Liver:

No focal lesion identified. Within normal limits in parenchymal
echogenicity. Portal vein is patent on color Doppler imaging with
normal direction of blood flow towards the liver.

Other: None.
IMPRESSION: Cholelithiasis without complicating factors.

No other focal abnormality is noted.

## 2021-09-17 ENCOUNTER — Emergency Department (HOSPITAL_COMMUNITY): Payer: Medicaid Other

## 2021-09-17 ENCOUNTER — Emergency Department (HOSPITAL_COMMUNITY)
Admission: EM | Admit: 2021-09-17 | Discharge: 2021-09-17 | Disposition: A | Discharge status: Home / Self Care | Payer: Medicaid Other | Attending: Emergency Medicine | Admitting: Emergency Medicine

## 2021-09-17 ENCOUNTER — Encounter (HOSPITAL_COMMUNITY): Payer: Self-pay | Admitting: Emergency Medicine

## 2021-09-17 ENCOUNTER — Other Ambulatory Visit: Payer: Self-pay

## 2021-09-17 DIAGNOSIS — R42 Dizziness and giddiness: Secondary | ICD-10-CM | POA: Insufficient documentation

## 2021-09-17 DIAGNOSIS — Y92410 Unspecified street and highway as the place of occurrence of the external cause: Secondary | ICD-10-CM | POA: Diagnosis not present

## 2021-09-17 DIAGNOSIS — M25531 Pain in right wrist: Secondary | ICD-10-CM | POA: Diagnosis not present

## 2021-09-17 DIAGNOSIS — R519 Headache, unspecified: Secondary | ICD-10-CM | POA: Insufficient documentation

## 2021-09-17 DIAGNOSIS — R0789 Other chest pain: Secondary | ICD-10-CM | POA: Insufficient documentation

## 2021-09-17 LAB — I-STAT BETA HCG BLOOD, ED (MC, WL, AP ONLY): I-stat hCG, quantitative: <5 m[IU]/mL (ref <5)
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
## 2021-09-17 NOTE — ED Notes (Signed)
Vitals not updated for discharge due to PA giving discharge papers and pt left before this writer could reassess.  ?
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
## 2021-12-19 ENCOUNTER — Other Ambulatory Visit: Payer: Self-pay

## 2021-12-19 ENCOUNTER — Emergency Department (HOSPITAL_COMMUNITY)
Admission: EM | Admit: 2021-12-19 | Discharge: 2021-12-20 | Disposition: A | Discharge status: Home / Self Care | Payer: Medicaid Other | Attending: Emergency Medicine | Admitting: Emergency Medicine

## 2021-12-19 DIAGNOSIS — W500XXA Accidental hit or strike by another person, initial encounter: Secondary | ICD-10-CM | POA: Diagnosis not present

## 2021-12-19 DIAGNOSIS — S060X1A Concussion with loss of consciousness of 30 minutes or less, initial encounter: Secondary | ICD-10-CM | POA: Diagnosis not present

## 2021-12-19 DIAGNOSIS — S0990XA Unspecified injury of head, initial encounter: Secondary | ICD-10-CM | POA: Diagnosis present

## 2021-12-19 DIAGNOSIS — S0083XA Contusion of other part of head, initial encounter: Secondary | ICD-10-CM | POA: Insufficient documentation
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
## 2021-12-20 ENCOUNTER — Emergency Department (HOSPITAL_COMMUNITY): Payer: Medicaid Other

## 2021-12-20 LAB — I-STAT BETA HCG BLOOD, ED (MC, WL, AP ONLY): I-stat hCG, quantitative: <5 m[IU]/mL (ref <5)

## 2021-12-20 MED ORDER — LACTATED RINGERS IV BOLUS
1000.0000 mL | Freq: Once | INTRAVENOUS | Status: AC
  Administered 2021-12-20: 1000 mL via INTRAVENOUS

## 2021-12-20 MED ORDER — DEXAMETHASONE SODIUM PHOSPHATE 10 MG/ML IJ SOLN
10.0000 mg | Freq: Once | INTRAMUSCULAR | Status: AC
  Administered 2021-12-20: 10 mg via INTRAVENOUS
  Filled 2021-12-20: qty 1

## 2021-12-20 MED ORDER — METOCLOPRAMIDE HCL 5 MG/ML IJ SOLN
10.0000 mg | Freq: Once | INTRAMUSCULAR | Status: AC
  Administered 2021-12-20: 10 mg via INTRAVENOUS
  Filled 2021-12-20: qty 2

## 2021-12-20 MED ORDER — KETOROLAC TROMETHAMINE 15 MG/ML IJ SOLN
15.0000 mg | Freq: Once | INTRAMUSCULAR | Status: AC
  Administered 2021-12-20: 15 mg via INTRAVENOUS
  Filled 2021-12-20: qty 1

## 2021-12-20 NOTE — ED Notes (Addendum)
Pt ambulated to restroom stand by assist.  

## 2021-12-20 NOTE — ED Notes (Signed)
Patient taken to CT at this time.
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
